# Patient Record
Sex: Male | Born: 1980 | Race: Black or African American | Hispanic: No | Marital: Married | Smoking: Former smoker
Health system: Southern US, Community
[De-identification: ages and names within clinical notes are randomized; demographics above are authoritative.]

## PROBLEM LIST (undated history)

## (undated) DIAGNOSIS — J45909 Unspecified asthma, uncomplicated: Secondary | ICD-10-CM

## (undated) DIAGNOSIS — J42 Unspecified chronic bronchitis: Secondary | ICD-10-CM

## (undated) HISTORY — PX: SURGERY OF LIP: SUR1315

---
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
## 1999-08-15 ENCOUNTER — Emergency Department (HOSPITAL_COMMUNITY): Admission: EM | Admit: 1999-08-15 | Discharge: 1999-08-15 | Payer: Self-pay | Admitting: Emergency Medicine

## 1999-12-27 ENCOUNTER — Emergency Department (HOSPITAL_COMMUNITY): Admission: EM | Admit: 1999-12-27 | Discharge: 1999-12-27 | Payer: Self-pay | Admitting: Emergency Medicine

## 1999-12-27 ENCOUNTER — Encounter: Payer: Self-pay | Admitting: Emergency Medicine

## 2001-06-05 ENCOUNTER — Emergency Department (HOSPITAL_COMMUNITY): Admission: EM | Admit: 2001-06-05 | Discharge: 2001-06-05 | Payer: Self-pay

## 2001-06-11 ENCOUNTER — Emergency Department (HOSPITAL_COMMUNITY): Admission: EM | Admit: 2001-06-11 | Discharge: 2001-06-11 | Payer: Self-pay | Admitting: Emergency Medicine

## 2001-08-16 ENCOUNTER — Emergency Department (HOSPITAL_COMMUNITY): Admission: EM | Admit: 2001-08-16 | Discharge: 2001-08-16 | Payer: Self-pay | Admitting: Emergency Medicine

## 2001-11-06 ENCOUNTER — Encounter: Payer: Self-pay | Admitting: Emergency Medicine

## 2001-11-06 ENCOUNTER — Emergency Department (HOSPITAL_COMMUNITY): Admission: EM | Admit: 2001-11-06 | Discharge: 2001-11-06 | Payer: Self-pay | Admitting: Unknown Physician Specialty

## 2001-11-22 ENCOUNTER — Emergency Department (HOSPITAL_COMMUNITY): Admission: EM | Admit: 2001-11-22 | Discharge: 2001-11-22 | Payer: Self-pay | Admitting: *Deleted

## 2002-04-04 ENCOUNTER — Emergency Department (HOSPITAL_COMMUNITY): Admission: EM | Admit: 2002-04-04 | Discharge: 2002-04-04 | Payer: Self-pay

## 2002-08-03 ENCOUNTER — Emergency Department (HOSPITAL_COMMUNITY): Admission: EM | Admit: 2002-08-03 | Discharge: 2002-08-03 | Payer: Self-pay | Admitting: *Deleted

## 2002-08-16 ENCOUNTER — Encounter: Payer: Self-pay | Admitting: *Deleted

## 2002-08-16 ENCOUNTER — Emergency Department (HOSPITAL_COMMUNITY): Admission: EM | Admit: 2002-08-16 | Discharge: 2002-08-16 | Payer: Self-pay | Admitting: Emergency Medicine

## 2005-07-10 ENCOUNTER — Emergency Department (HOSPITAL_COMMUNITY): Admission: EM | Admit: 2005-07-10 | Discharge: 2005-07-10 | Payer: Self-pay | Admitting: Emergency Medicine

## 2005-07-19 ENCOUNTER — Emergency Department (HOSPITAL_COMMUNITY): Admission: EM | Admit: 2005-07-19 | Discharge: 2005-07-19 | Payer: Self-pay | Admitting: Family Medicine

## 2006-10-21 ENCOUNTER — Emergency Department (HOSPITAL_COMMUNITY): Admission: EM | Admit: 2006-10-21 | Discharge: 2006-10-21 | Payer: Self-pay | Admitting: Emergency Medicine

## 2011-08-10 ENCOUNTER — Emergency Department (HOSPITAL_COMMUNITY)
Admission: EM | Admit: 2011-08-10 | Discharge: 2011-08-10 | Disposition: A | Discharge status: Home / Self Care | Payer: Self-pay | Attending: Emergency Medicine | Admitting: Emergency Medicine

## 2011-08-10 ENCOUNTER — Encounter: Payer: Self-pay | Admitting: *Deleted

## 2011-08-10 DIAGNOSIS — L2989 Other pruritus: Secondary | ICD-10-CM | POA: Insufficient documentation

## 2011-08-10 DIAGNOSIS — L298 Other pruritus: Secondary | ICD-10-CM | POA: Insufficient documentation

## 2011-08-10 DIAGNOSIS — H109 Unspecified conjunctivitis: Secondary | ICD-10-CM

## 2011-08-10 DIAGNOSIS — H5789 Other specified disorders of eye and adnexa: Secondary | ICD-10-CM | POA: Insufficient documentation

## 2011-08-10 DIAGNOSIS — H5710 Ocular pain, unspecified eye: Secondary | ICD-10-CM | POA: Insufficient documentation

## 2011-08-10 MED ORDER — TETRACAINE HCL 0.5 % OP SOLN
1.0000 [drp] | Freq: Once | OPHTHALMIC | Status: AC
  Administered 2011-08-10: 1 [drp] via OPHTHALMIC
  Filled 2011-08-10: qty 2

## 2011-08-10 MED ORDER — NAPHAZOLINE HCL 0.1 % OP SOLN: 1.0000 [drp] | Freq: Four times a day (QID) | OPHTHALMIC | Status: AC | PRN

## 2011-08-10 MED ORDER — FLUORESCEIN SODIUM 1 MG OP STRP
1.0000 | ORAL_STRIP | Freq: Once | OPHTHALMIC | Status: AC
  Administered 2011-08-10: 1 via OPHTHALMIC
  Filled 2011-08-10: qty 1

## 2011-08-10 NOTE — ED Notes (Signed)
Pt reports 2 day hx of left eye burning and redness.

## 2011-08-10 NOTE — Progress Notes (Signed)
30 year old male has had redness and irritation of his right eye for the last 2 days. He has no known sick contacts. There's been some watery drainage, but no purulent drainage. He denies any photophobia. Exam is consistent with a viral conjunctivitis.

## 2011-08-10 NOTE — ED Provider Notes (Signed)
History     CSN: [id number] Arrival date & time: 08/10/2011  4:08 PM   First MD Initiated Contact with Patient 08/10/11 1616      Chief Complaint  Patient presents with  . Eye Problem    (Consider location/radiation/quality/duration/timing/severity/associated sxs/prior treatment) The history is provided by the patient.   patient presents with left eye pain and redness for the last 2 days. He has been constant and progressively worsening. Patient notes associated itching of eye and mild pain. He denies change in visual acuity, preceding trauma, foreign bodies, periorbital swelling, recent fever, sick contact. Overall severity described as mild.  He has tried hot compresses which made him mildly better.  Past Medical History  Diagnosis Date  . Bronchitis     No past surgical history on file.  No family history on file.  History  Substance Use Topics  . Smoking status: Never Smoker   . Smokeless tobacco: Not on file  . Alcohol Use: Yes      Review of Systems  Constitutional: Negative for fever, chills and activity change.  HENT: Negative for congestion and neck pain.   Eyes: Positive for redness and itching.  Respiratory: Negative for cough, chest tightness, shortness of breath and wheezing.   Cardiovascular: Negative for chest pain.  Gastrointestinal: Negative for nausea, vomiting, abdominal pain, diarrhea and abdominal distention.  Genitourinary: Negative for difficulty urinating.  Musculoskeletal: Negative for gait problem.  Skin: Negative for rash.  Neurological: Negative for weakness and numbness.  Psychiatric/Behavioral: Negative for behavioral problems and confusion.  All other systems reviewed and are negative.    Allergies  Review of patient's allergies indicates no known allergies.  Home Medications   Current Outpatient Rx  Name Route Sig Dispense Refill  . NAPHAZOLINE HCL 0.1 % OP SOLN Left Eye Place 1 drop into the left eye 4 (four) times daily as  needed. 15 mL 0    BP 133/94  Temp(Src) 97.7 F (36.5 C) (Oral)  Resp 18  SpO2 100%  Physical Exam  Nursing note and vitals reviewed. Constitutional: He is oriented to person, place, and time. He appears well-developed and well-nourished. No distress.  HENT:  Head: Normocephalic.  Nose: Nose normal.  Eyes: EOM are normal. Pupils are equal, round, and reactive to light.       Visual acuity: Right eye 20/25, left eye 20/25 Left eye: Diffuse injection. Fluorescein staining done and no uptake suggesting corneal abrasion. No pain with consensual constriction.  Neck: Normal range of motion.  Cardiovascular: Regular rhythm and intact distal pulses.   Pulmonary/Chest: Effort normal. No respiratory distress.  Abdominal: He exhibits no distension.       No visible injury  Musculoskeletal: Normal range of motion. He exhibits no edema and no tenderness.  Neurological: He is alert and oriented to person, place, and time.       Normal strength  Skin: Skin is warm and dry. He is not diaphoretic.  Psychiatric: He has a normal mood and affect. His behavior is normal. Thought content normal.    ED Course  Procedures (including critical care time)  Labs Reviewed - No data to display No results found.   1. Conjunctivitis       MDM   Overall clinical picture is consistent with left eye conjunctivitis. No corneal abrasions. No preceding trauma to suggest iritis. Most likely viral versus allergic in etiology based on her recent discharge. Treatment options and return precautions discussed.        Milus Glazier  08/11/11 0128 

## 2011-08-13 NOTE — ED Provider Notes (Signed)
I saw and evaluated the patient, reviewed the resident's note and I agree with the findings and plan.   Dione Booze, MD 08/13/11 [phone]

## 2014-10-18 ENCOUNTER — Emergency Department (HOSPITAL_COMMUNITY)
Admission: EM | Admit: 2014-10-18 | Discharge: 2014-10-18 | Disposition: A | Discharge status: Home / Self Care | Payer: Self-pay | Attending: Emergency Medicine | Admitting: Emergency Medicine

## 2014-10-18 ENCOUNTER — Encounter (HOSPITAL_COMMUNITY): Payer: Self-pay | Admitting: *Deleted

## 2014-10-18 DIAGNOSIS — Y9289 Other specified places as the place of occurrence of the external cause: Secondary | ICD-10-CM | POA: Insufficient documentation

## 2014-10-18 DIAGNOSIS — X58XXXA Exposure to other specified factors, initial encounter: Secondary | ICD-10-CM | POA: Insufficient documentation

## 2014-10-18 DIAGNOSIS — Y998 Other external cause status: Secondary | ICD-10-CM | POA: Insufficient documentation

## 2014-10-18 DIAGNOSIS — Z8709 Personal history of other diseases of the respiratory system: Secondary | ICD-10-CM | POA: Insufficient documentation

## 2014-10-18 DIAGNOSIS — S39840A Fracture of corpus cavernosum penis, initial encounter: Secondary | ICD-10-CM | POA: Insufficient documentation

## 2014-10-18 DIAGNOSIS — Y9389 Activity, other specified: Secondary | ICD-10-CM | POA: Insufficient documentation

## 2014-10-18 NOTE — Consult Note (Signed)
Urology Consult  Referring physician: Terri Piedraourtney Forcucci, PA-C [facility] Reason for referral: Concern for penile fracture/blood per urethra  History of Present Illness: Daniel Berger is 34 years of age and without prior urologic history. Last night during sexual activity he had penile trauma. During intercourse his penis struck his partners pubic bone. He does not recall hearing a popping or cracking sound. He did not develop immediate detumescence of his penis. Initially he did notice blood from his urethra but then it subsequently subsided. He has continued throughout the day now to have initial hematuria. He otherwise reports a strong urinary stream and clear urine once the initial blood comes out. He reports having 1 or 2 erection since the injury that he describes as fairly normal. He believes that there was moderate swelling of the penile shaft but not until this morning hours after the incident. He does admit however to drinking alcohol during the event. More recently he has noticed considerable edema underneath the glans penis. He is continued to have a small amount of oozing from the meatus.  Past Medical History  Diagnosis Date  . Bronchitis    History reviewed. No pertinent past surgical history.  Medications: Prior to Admission:  (Not in a hospital admission)  Allergies: No Known Allergies  No family history on file.  Social History:  reports that he has never smoked. He does not have any smokeless tobacco history on file. He reports that he drinks alcohol. His drug history is not on file.  ROS only for above-mentioned complaints. Otherwise negative.  Physical Exam:  Vital signs in last 24 hours: Temp:  [97.6 F (36.4 C)] 97.6 F (36.4 C) (02/20 2041) Pulse Rate:  [64-67] 67 (02/20 2131) Resp:  [16-18] 16 (02/20 2131) BP: (151-156)/(100-109) 151/100 mmHg (02/20 2131) SpO2:  [100 %] 100 % (02/20 2131) Weight:  [63.504 kg (140 lb)] 63.504 kg (140 lb) (02/20 2041)  Constitutional:  Vital signs reviewed. WD WN in NAD Head: Normocephalic and atraumatic   Eyes: PERRL, No scleral icterus.  Neck: Supple No  Gross JVD Cardiovascular: RRR Pulmonary/Chest: Normal effort Abdominal: Soft. Non-tender, non-distended. Genitourinary: The patient has a circumcised penis. There is considerable soft tissue edema in the frenular region. The entire shaft is mildly swollen. There is mild oozing of blood from the meatus. There is slight swelling in the skin on the anterior aspect of the scrotum near the penile scrotal junction. Palpation of the distal corporal cylinders is normal. More proximally there is an area where I do feel there is a probable rent consistent with a penile fracture. With palpation of that area we noticed increased blood per urethra. Testes adnexal structures within normal limits. Extremities: No cyanosis or edema  Neurological: Grossly non-focal.  Skin: Warm,very dry and intact. No rash, cyanosis   Laboratory Data:  No results found for this or any previous visit (from the past 72 hour(s)). No results found for this or any previous visit (from the past 240 hour(s)). Creatinine: No results for input(s): CREATININE in the last 168 hours. Baseline Creatinine:   Impression/Assessment:  I believe it is extremely likely that this patient does indeed have a proximal corporal fracture and probable urethral injury/tear. He is told that the standard of care in this situation is penile exploration with a degloving incision and careful inspection of both corporal cylinders. If a tear or rent is found which in my opinion is likely that would be surgically corrected. He would need further evaluation of his urethra with flexible  ureteroscopy and or retrograde urethrogram. If there does appear to be urethral injury then catheter placement will have to be performed and he may need an indwelling catheter for a period of time. He is told that data shows that without surgical correction of  penile fracture there is going to be a higher risk of subsequently developing erectile dysfunction, penile curvature/deformity and in the case of urethral injury potentially a urethral stricture.  It is my strong recommendation that we proceed with an exploration tonight with repair of penile fracture if indeed 1 is found. He appeared to understand this and asked appropriate and reasonable questions. He expressed considerable hesitancy with going ahead with surgery especially tonight. He told me that he had nobody to look after his children and that there was no way he could have a surgical procedure this evening. We did talk about his options for doing something tomorrow but he was noncommittal.  Plan:  Patient has refused/deferred penile exploration with probable repair of penile fracture and evaluation for possible urethral injury. He appeared to understand the rationale for proceeding with surgical intervention and seemed to understand the potential increased risk he will have by delaying or failing to undergo the appropriate surgical management. He is told that we would be happy to proceed with surgical intervention if he changes his mind over the next 72 hours. If he does report present for consideration of surgery I suggested he go to [facility] long emergency room since we do more urologic procedures at that facility. I have asked the ER staff to provide him with my name and number as well as our office address. If he decides not to re-presented this weekend I would strongly encourage him to call to get in early this coming week for reassessment. We potentially could operate on him as late as one week out from a fracture although again sooner is better in my opinion.  Zyiah Withington S 10/18/2014, 10:41 PM

## 2014-10-18 NOTE — ED Notes (Signed)
Pt c/o penile swelling and bloody discharge onset last night after injuring penis during intercourse. Reports a gush of blood when injury occurred that subsided; has noticed blood in his urine since this morning. Denies pain at this time or urinary issues.

## 2014-10-18 NOTE — ED Notes (Signed)
Urology at bedside.

## 2014-10-18 NOTE — Discharge Instructions (Signed)
Penile Fracture Fracture of the penis is an uncommon injury of the erect penis. This injury most often happens during forceful sexual intercourse, but it may happen under any circumstances when an erection occurs. This is not a fractured bone. The penis contains only soft and fibrous (tough leathery) tissue. The tough fibrous layers inside are the structures which fill up with blood during an erection (hardening of the penis). If a rupture or break of one of these layers occurs, it is called a penile fracture. There may also be injury to the urethra (the tube in the penis which carries the urine from the bladder). SYMPTOMS  This injury is usually noticed right away because of:  Pain.  A changed shape of the penis. DIAGNOSIS   The diagnosis can be made by your caregiver talking to you and learning how the injury happened.  The diagnosis can be made by examining you.  Specialized testing may sometimes be done to confirm a suspected diagnosis or to find out what damage needs to be repaired during surgery. These tests include:  Ultrasonography (imaging technique used to look inside the body).  Cavernosography (x-ray of the blood flow in the penis).  Urethrography (x-ray of the urethra). TREATMENT  When the problem is severe, it is considered a surgical emergency. The sooner the problem is repaired, the more likely there will be a good result. Emergency surgical repair offers the best chance of recovery with a correct working penis.  If conservative (nonsurgical) treatment is used, there may be other complications such as:  A bulging out of the side of the penis (aneurysm)  Scarring and hardening of the penis  Abnormal curvature  Erectile dysfunction (problems with impotence)  The final outcome will be unsatisfactory or poor. Minor cases of fractured penis may be treated conservatively. The decision to treat conservatively or with surgery should be made with your urologist to fully  understand the pros and cons of these different treatments. Document Released: 06/28/2004 Document Revised: 11/07/2011 Document Reviewed: 06/27/2008 [facility] Patient Information 2015 [place], [place]. This information is not intended to replace advice given to you by your health care provider. Make sure you discuss any questions you have with your health care provider.   Emergency Department Resource Guide 1) Find a Doctor and Pay Out of Pocket Although you won't have to find out who is covered by your insurance plan, it is a good idea to ask around and get recommendations. You will then need to call the office and see if the doctor you have chosen will accept you as a new patient and what types of options they offer for patients who are self-pay. Some doctors offer discounts or will set up payment plans for their patients who do not have insurance, but you will need to ask so you aren't surprised when you get to your appointment.  2) Contact Your Local Health Department Not all health departments have doctors that can see patients for sick visits, but many do, so it is worth a call to see if yours does. If you don't know where your local health department is, you can check in your phone book. The CDC also has a tool to help you locate your state's health department, and many state websites also have listings of all of their local health departments.  3) Find a Walk-in Clinic If your illness is not likely to be very severe or complicated, you may want to try a walk in clinic. These are popping up all  over the country in pharmacies, drugstores, and shopping centers. They're usually staffed by nurse practitioners or physician assistants that have been trained to treat common illnesses and complaints. They're usually fairly quick and inexpensive. However, if you have serious medical issues or chronic medical problems, these are probably not your best option.  No Primary Care Doctor: - Call Health Connect  at  [phone] - they can help you locate a primary care doctor that  accepts your insurance, provides certain services, etc. - Physician Referral Service- [phone]  Chronic Pain Problems: Organization         Address  Phone   Notes  [facility]  [phone] Patients need to be referred by their primary care doctor.   Medication Assistance: Organization         Address  Phone   Notes  [facility] Medication [facility] [address] [place] [place], [place] [address] [phone] --Must be a resident of [facility] -- Must have NO insurance coverage whatsoever (no Medicaid/ Medicare, etc.) -- The pt. MUST have a primary care doctor that directs their care regularly and follows them in the community   [facility]  [phone]   [employer]  [phone]    Agencies that provide inexpensive medical care: Organization         Address  Phone   Notes  [facility]  [phone]   [facility]    [phone]   [facility] [address] [place], [place] [address] [phone]   [facility] [address] [place] [phone]   Planned Parenthood    [phone]   [facility]    [phone]   [facility]  [address], [place] Phone:  [phone], Fax:  [phone] Hours of Operation:  9 am - 6 pm, M-F.  Also accepts Medicaid/Medicare and self-pay.  [facility]  [address], [place] Phone: [phone], Fax: [phone]. Hours of Operation:  8:30 am - 5:30 pm, M-F.  Also accepts Medicaid and self-pay.  [facility] [place] [address], [place] Phone: [phone]   [facility] [address] Natasha Bence [place], [place] [phone], Ext. [phone] Mondays & Thursdays: 7-9 AM.  First 15 patients are seen on a first come, first serve basis.     Medicaid-accepting [facility] Providers:  Organization         Address  Phone   Notes  [facility] [address], [place] [phone] Also accepts self-pay patients.  [facility] [address] Laurell Josephs [place], [place]  [phone]   [facility] [address], [place] [phone]   [facility] [address] [place] [phone]   Renaye Rakers [address], [place]   [phone] Only accepts [place] Access [place] patients after they have their name applied to their card.   Self-Pay (no insurance) in [facility]:  Organization         Address  Phone   Notes  Sickle Cell Patients, [facility] [address] [place], [place] [phone]   [facility] [address] [place], [place] [phone]   [facility] [place]  [address],  [phone]   [facility]/Dr. Osei-Bonsu  [address] [place] [address], [place] or [address], [place] [phone] Phone number for both [place] and [place] locations is the same.  [facility] [address], [place] [phone]) [phone]   [facility] [place] [address] [place] [address], [place] or [address] [phone]) [phone]   [facility] [address], [place] [phone]) [phone], phone; [phone](336) [phone], fax Sees patients 1st and 3rd Saturday of every month.  Must not qualify for public or private insurance (i.e. Medicaid, Medicare, [place] Health Choice, Veterans' Benefits)  Household income should be no more than 200% of the poverty level The clinic cannot treat you if you are pregnant or think you are pregnant  Sexually transmitted diseases are not treated at the clinic.    Dental Care: Organization         Address  Phone  Notes  [facility]  [facility] [address], [place] [phone] Accepts children up to age 35 who are enrolled in [place] or [place] Health Choice; pregnant women with a Medicaid card; and children who have applied for Medicaid or [place] Health Choice, but were declined, whose parents can pay a reduced fee at time of service.  [facility] [place]  [address], [place] [phone] Accepts children up to age 721 who are enrolled in [place] or [place] Health Choice; pregnant women with a Medicaid card; and children who have applied for Medicaid or [place] Health Choice, but were declined, whose parents can pay a reduced fee at time of service.  [facility]  [address], [place] [phone]) [phone] Patients are seen by appointment only. Walk-ins are not accepted. [facility] will see patients 35 years of age and older. Monday - Tuesday (8am-5pm) Most Wednesdays (8:30-5pm) $30 per visit, cash only  [facility]  [address], [facility] [place] [phone]) [phone] Patients are seen by appointment only. Walk-ins are not accepted. [facility] will see patients 35 years of age and older. One Wednesday Evening (Monthly: Volunteer Based).  $30 per visit, cash only  [facility]  [phone] for adults; Children under age 35, call Graduate Pediatric Dentistry at [phone]. Children aged 594-14, please call [phone] to request a pediatric application.  Dental services are provided in all areas of dental care including fillings, crowns and bridges, complete and partial dentures, implants, gum treatment, root canals, and extractions. Preventive care is also provided. Treatment is provided to both adults and children. Patients are selected via a lottery and there is often a waiting list.   [facility] [address], [facility]  [phone]) [phone]  [email or web address]   [facility] [address], [place], [place] [phone], Ext. [phone] Second and Fourth Thursday of each month, opens at 6:30 AM; Clinic ends at 9 AM.  Patients are seen on a first-come first-served basis, and a limited number are seen during each clinic.   [facility]  [address], [place], [place] [phone]   Eligibility Requirements You must have lived in [place], [place], or [place] counties for at least the last three months.   You cannot be eligible for state or federal sponsored National Cityhealthcare insurance, including CIGNAVeterans Administration, [place]icaid, or [employer]icare.  You generally cannot be eligible for healthcare insurance through your employer.    How to apply: Eligibility screenings are held every Tuesday and Wednesday afternoon from 1:00 pm until 4:00 pm. You do not need an appointment for the interview!  [facility] [address] [place], [place] [phone]   [facility] Health Department  [phone]   [facility] Health Department  [phone]   [facility] Health Department  [phone]    Behavioral Health Resources in the Community: Intensive Outpatient Programs Organization         Address  Phone  Notes  [facility] Services [address], [place], [place] [phone]   [facility] [address], [place], [place] [phone]   [facility] [address], [place], [place]  [phone]   [facility] Mental Health [address]  [place], [place] [phone] or [phone]   Substance Abuse Resources Organization         Address  Phone  Notes  [facility] Drug Services  [phone]   [facility]  [phone]   [facility]  [phone]   Floydene Flock  [phone]   Residential & Outpatient Substance Abuse Program  [phone]   Psychological Services Organization          Address  Phone  Notes  [facility]  [phone]   [facility]  [phone]   [facility] Mental Health [address], [place] [phone] or [phone]    Mobile Crisis Teams Organization         Address  Phone  Notes  [facility], Mobile Crisis Care Unit  [phone]   [facility]  [address] [place], [place] [phone]   Doristine Locks [address] [place] [phone]    Self-Help/Support Groups Organization         Address  Phone             Notes  [facility] [place] - variety of support groups  [phone]- I7437963 Call for more information  Narcotics Anonymous (NA), [facility] [address], [employer] [place]  2 meetings at this location   Statistician         Address  Phone  Notes  [facility] [address],    [place]  [phone]   [facility]  [address], [place] [address], [place], [place] [phone]   [facility] [address] [place], [place] [phone] Admissions: 8am-3pm M-F  [facility] [address],    [place], [place] [phone]   [facility] [address] [place], [place], [place] [phone]   [facility] [address],  [place], [place] [phone]   [facility] - Intensive Outpatient [address]urell Josephs [address], [place], [place] [phone]   [facility] ([facility] [address] [place].,  [place], [place] [phone] or [phone]   [facility] ([facility]) [address], [place], [place] [phone] Accepts Medicaid  [facility] [address]  [place] [phone] Substance Abuse/Addiction Treatment   [facility] Organization         Address  Phone  Notes  [facility]  [phone]   Angie Fava, PhD [address] Skeeters [place], [place]   [phone] or [phone]   [facility]  [place], [place] [phone]   [facility] [address] [place], [place] [phone] Insurance/Medicaid/sponsorship through [facility] Families [address] [place], [place] [phone] [place], [place] [phone]    Dr. Adele Schilder  [phone]   [facility] Dept. 1) [address] [place] 2) [place] 3)  [address], [place] [phone]  [phone]   [place] [phone] or [phone] (After Hours)

## 2014-10-18 NOTE — ED Notes (Signed)
The pt is c/o penis pain and bloody discharge since last pm.  No pain now
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
## 2016-09-09 ENCOUNTER — Emergency Department (HOSPITAL_COMMUNITY): Payer: Self-pay

## 2016-09-09 ENCOUNTER — Encounter (HOSPITAL_COMMUNITY): Payer: Self-pay | Admitting: Radiology

## 2016-09-09 ENCOUNTER — Inpatient Hospital Stay (HOSPITAL_COMMUNITY)
Admission: EM | Admit: 2016-09-09 | Discharge: 2016-09-16 | DRG: 392 | Disposition: A | Discharge status: Home Health | Payer: Self-pay | Attending: Internal Medicine | Admitting: Internal Medicine

## 2016-09-09 DIAGNOSIS — K651 Peritoneal abscess: Secondary | ICD-10-CM | POA: Diagnosis present

## 2016-09-09 DIAGNOSIS — K632 Fistula of intestine: Secondary | ICD-10-CM | POA: Diagnosis present

## 2016-09-09 DIAGNOSIS — Z789 Other specified health status: Secondary | ICD-10-CM | POA: Diagnosis present

## 2016-09-09 DIAGNOSIS — F17210 Nicotine dependence, cigarettes, uncomplicated: Secondary | ICD-10-CM | POA: Diagnosis present

## 2016-09-09 DIAGNOSIS — Z833 Family history of diabetes mellitus: Secondary | ICD-10-CM

## 2016-09-09 DIAGNOSIS — Z7289 Other problems related to lifestyle: Secondary | ICD-10-CM | POA: Diagnosis present

## 2016-09-09 DIAGNOSIS — F1010 Alcohol abuse, uncomplicated: Secondary | ICD-10-CM | POA: Diagnosis present

## 2016-09-09 DIAGNOSIS — K5780 Diverticulitis of intestine, part unspecified, with perforation and abscess without bleeding: Secondary | ICD-10-CM

## 2016-09-09 DIAGNOSIS — K5720 Diverticulitis of large intestine with perforation and abscess without bleeding: Principal | ICD-10-CM | POA: Diagnosis present

## 2016-09-09 DIAGNOSIS — B9620 Unspecified Escherichia coli [E. coli] as the cause of diseases classified elsewhere: Secondary | ICD-10-CM | POA: Diagnosis present

## 2016-09-09 DIAGNOSIS — Z8249 Family history of ischemic heart disease and other diseases of the circulatory system: Secondary | ICD-10-CM

## 2016-09-09 DIAGNOSIS — J45909 Unspecified asthma, uncomplicated: Secondary | ICD-10-CM | POA: Diagnosis present

## 2016-09-09 DIAGNOSIS — F1090 Alcohol use, unspecified, uncomplicated: Secondary | ICD-10-CM | POA: Diagnosis present

## 2016-09-09 DIAGNOSIS — N2882 Megaloureter: Secondary | ICD-10-CM

## 2016-09-09 HISTORY — DX: Unspecified asthma, uncomplicated: J45.909

## 2016-09-09 LAB — CBC
HCT: 36.5 % — ABNORMAL LOW (ref 39.0–52.0)
HEMOGLOBIN: 12.3 g/dL — AB (ref 13.0–17.0)
MCH: 30.4 pg (ref 26.0–34.0)
MCHC: 33.7 g/dL (ref 30.0–36.0)
MCV: 90.1 fL (ref 78.0–100.0)
Platelets: 440 10*3/uL — ABNORMAL HIGH (ref 150–400)
RBC: 4.05 MIL/uL — ABNORMAL LOW (ref 4.22–5.81)
RDW: 12.6 % (ref 11.5–15.5)
WBC: 12 10*3/uL — AB (ref 4.0–10.5)

## 2016-09-09 LAB — COMPREHENSIVE METABOLIC PANEL
ALT: 22 U/L (ref 17–63)
ANION GAP: 11 (ref 5–15)
AST: 14 U/L — ABNORMAL LOW (ref 15–41)
Albumin: 3 g/dL — ABNORMAL LOW (ref 3.5–5.0)
Alkaline Phosphatase: 57 U/L (ref 38–126)
BILIRUBIN TOTAL: 0.4 mg/dL (ref 0.3–1.2)
BUN: 5 mg/dL — ABNORMAL LOW (ref 6–20)
CO2: 25 mmol/L (ref 22–32)
Calcium: 8.9 mg/dL (ref 8.9–10.3)
Chloride: 101 mmol/L (ref 101–111)
Creatinine, Ser: 1.02 mg/dL (ref 0.61–1.24)
Glucose, Bld: 96 mg/dL (ref 65–99)
Potassium: 4.3 mmol/L (ref 3.5–5.1)
Sodium: 137 mmol/L (ref 135–145)
TOTAL PROTEIN: 7.6 g/dL (ref 6.5–8.1)

## 2016-09-09 LAB — URINALYSIS, ROUTINE W REFLEX MICROSCOPIC
Bilirubin Urine: NEGATIVE
Glucose, UA: NEGATIVE mg/dL
Hgb urine dipstick: NEGATIVE
KETONES UR: NEGATIVE mg/dL
LEUKOCYTES UA: NEGATIVE
Nitrite: NEGATIVE
Protein, ur: NEGATIVE mg/dL
SPECIFIC GRAVITY, URINE: 1.018 (ref 1.005–1.030)
pH: 5 (ref 5.0–8.0)

## 2016-09-09 LAB — LIPASE, BLOOD: Lipase: 25 U/L (ref 11–51)

## 2016-09-09 MED ORDER — PIPERACILLIN-TAZOBACTAM 3.375 G IVPB
3.3750 g | Freq: Once | INTRAVENOUS | Status: DC
  Filled 2016-09-09: qty 50

## 2016-09-09 MED ORDER — IOPAMIDOL (ISOVUE-300) INJECTION 61%
100.0000 mL | Freq: Once | INTRAVENOUS | Status: AC | PRN
  Administered 2016-09-09: 100 mL via INTRAVENOUS

## 2016-09-09 NOTE — ED Provider Notes (Signed)
[facility]-EMERGENCY DEPT Provider Note   CSN: [id number] Arrival date & time: 09/09/16  1241     History   Chief Complaint Chief Complaint  Patient presents with  . Abdominal Pain  . Diarrhea    HPI Daniel Berger is a 36 y.o. male. He reports a sensation of abdominal discomfort, associated with pain in his anal area when sitting, and feeling like he can have a good bowel movement. He is taking milk of magnesia and having loose stools but does not feel that he is completely evacuating. He has had decreased appetite but no nausea or vomiting. No penile discharge, dysuria, hematuria. No scrotal pain. He denies fever, chills, cough, chest pain or back pain. No similar problem in the past.  HPI  Past Medical History:  Diagnosis Date  . Bronchitis     There are no active problems to display for this patient.   No past surgical history on file.     Home Medications    Prior to Admission medications   Not on File    Family History No family history on file.  Social History Social History  Substance Use Topics  . Smoking status: Never Smoker  . Smokeless tobacco: Not on file  . Alcohol use Yes     Allergies   Patient has no known allergies.   Review of Systems Review of Systems  All other systems reviewed and are negative.    Physical Exam Updated Vital Signs BP 104/72   Pulse 71   Temp 98.4 F (36.9 C) (Oral)   Resp 14   SpO2 93%   Physical Exam  Constitutional: He is oriented to person, place, and time. He appears well-developed and well-nourished. No distress.  HENT:  Head: Normocephalic and atraumatic.  Right Ear: External ear normal.  Left Ear: External ear normal.  Eyes: Conjunctivae and EOM are normal. Pupils are equal, round, and reactive to light.  Neck: Normal range of motion and phonation normal. Neck supple.  Cardiovascular: Normal rate, regular rhythm and normal heart sounds.   Pulmonary/Chest: Effort normal and breath sounds normal.  He exhibits no bony tenderness.  Abdominal: Soft. There is no tenderness.  Genitourinary: Rectum normal and penis normal.  Genitourinary Comments: Normal anus. No stool in rectum. Prostate somewhat enlarged and tender. State nodularity. Normal scrotum and scrotum contents.  Musculoskeletal: Normal range of motion.  Neurological: He is alert and oriented to person, place, and time. No cranial nerve deficit or sensory deficit. He exhibits normal muscle tone. Coordination normal.  Skin: Skin is warm, dry and intact.  Psychiatric: He has a normal mood and affect. His behavior is normal. Judgment and thought content normal.  Nursing note and vitals reviewed.    ED Treatments / Results  Labs (all labs ordered are listed, but only abnormal results are displayed) Labs Reviewed  COMPREHENSIVE METABOLIC PANEL - Abnormal; Notable for the following:       Result Value   BUN 5 (*)    Albumin 3.0 (*)    AST 14 (*)    All other components within normal limits  CBC - Abnormal; Notable for the following:    WBC 12.0 (*)    RBC 4.05 (*)    Hemoglobin 12.3 (*)    HCT 36.5 (*)    Platelets 440 (*)    All other components within normal limits  LIPASE, BLOOD  URINALYSIS, ROUTINE W REFLEX MICROSCOPIC    EKG  EKG Interpretation None  Radiology No results found.  Procedures Procedures (including critical care time)  Medications Ordered in ED Medications - No data to display   Initial Impression / Assessment and Plan / ED Course  I have reviewed the triage vital signs and the nursing notes.  Pertinent labs & imaging results that were available during my care of the patient were reviewed by me and considered in my medical decision making (see chart for details).  Clinical Course     Medications - No data to display  Patient Vitals for the past 24 hrs:  BP Temp Temp src Pulse Resp SpO2  09/09/16 2100 104/72 - - 71 - 93 %  09/09/16 2045 110/76 - - 73 - 94 %  09/09/16 2030  101/72 - - 71 - 96 %  09/09/16 2015 103/72 - - 76 - 97 %  09/09/16 2000 107/72 - - 74 - 96 %  09/09/16 1945 106/68 - - 74 - 97 %  09/09/16 1930 109/68 - - 74 - 95 %  09/09/16 1915 125/81 - - 79 - 99 %  09/09/16 1828 111/72 - - 90 14 99 %  09/09/16 1544 116/74 - - 77 16 97 %  09/09/16 1259 131/98 98.4 F (36.9 C) Oral 88 16 99 %    9:23 PM Reevaluation with update and discussion. After initial assessment and treatment, an updated evaluation reveals No change in clinical status. Patient updated on findings and plan.Flint Melter. Ledarrius Beauchaine L   Consultation Gen. Surgery-  He will see. 00:06  11:41 PM-Consult complete with Hospitalist. Patient case explained and discussed. He agrees to admit patient for further evaluation and treatment. Call ended at 00:20   Final Clinical Impressions(s) / ED Diagnoses   Final diagnoses:  Diverticulitis of large intestine with abscess without bleeding   Pelvic abscess, source, diverticulitis, sigmoid colon. Doubt sepsis, metabolic instability or impending vascular collapse.  Nursing Notes Reviewed/ Care Coordinated Applicable Imaging Reviewed Interpretation of Laboratory Data incorporated into ED treatment  Plan : Admit   New Prescriptions New Prescriptions   No medications on file     Mancel BaleElliott Alyus Mofield, MD 09/10/16 [phone]

## 2016-09-09 NOTE — ED Notes (Signed)
Patient transported to CT 

## 2016-09-09 NOTE — ED Triage Notes (Signed)
aBD PAIN AND DIARRHEA  X 1 week , deneis dysuria

## 2016-09-10 ENCOUNTER — Encounter (HOSPITAL_COMMUNITY): Payer: Self-pay | Admitting: Internal Medicine

## 2016-09-10 ENCOUNTER — Inpatient Hospital Stay (HOSPITAL_COMMUNITY): Payer: Self-pay

## 2016-09-10 DIAGNOSIS — J45909 Unspecified asthma, uncomplicated: Secondary | ICD-10-CM | POA: Diagnosis present

## 2016-09-10 DIAGNOSIS — Z7289 Other problems related to lifestyle: Secondary | ICD-10-CM | POA: Diagnosis present

## 2016-09-10 DIAGNOSIS — K5720 Diverticulitis of large intestine with perforation and abscess without bleeding: Secondary | ICD-10-CM | POA: Diagnosis present

## 2016-09-10 DIAGNOSIS — K651 Peritoneal abscess: Secondary | ICD-10-CM | POA: Diagnosis present

## 2016-09-10 DIAGNOSIS — Z789 Other specified health status: Secondary | ICD-10-CM | POA: Diagnosis present

## 2016-09-10 LAB — CBC
HCT: 36.1 % — ABNORMAL LOW (ref 39.0–52.0)
Hemoglobin: 12.1 g/dL — ABNORMAL LOW (ref 13.0–17.0)
MCH: 30 pg (ref 26.0–34.0)
MCHC: 33.5 g/dL (ref 30.0–36.0)
MCV: 89.6 fL (ref 78.0–100.0)
PLATELETS: 444 10*3/uL — AB (ref 150–400)
RBC: 4.03 MIL/uL — AB (ref 4.22–5.81)
RDW: 12.6 % (ref 11.5–15.5)
WBC: 10.7 10*3/uL — ABNORMAL HIGH (ref 4.0–10.5)

## 2016-09-10 LAB — BASIC METABOLIC PANEL
Anion gap: 9 (ref 5–15)
BUN: 6 mg/dL (ref 6–20)
CALCIUM: 8.8 mg/dL — AB (ref 8.9–10.3)
CHLORIDE: 102 mmol/L (ref 101–111)
CO2: 24 mmol/L (ref 22–32)
CREATININE: 0.84 mg/dL (ref 0.61–1.24)
GFR calc non Af Amer: >60 mL/min (ref >60)
GLUCOSE: 93 mg/dL (ref 65–99)
Potassium: 3.9 mmol/L (ref 3.5–5.1)
Sodium: 135 mmol/L (ref 135–145)

## 2016-09-10 LAB — PROTIME-INR
INR: 1.3
PROTHROMBIN TIME: 16.3 s — AB (ref 11.4–15.2)

## 2016-09-10 LAB — TYPE AND SCREEN
ABO/RH(D): B POS
Antibody Screen: NEGATIVE

## 2016-09-10 LAB — ABO/RH: ABO/RH(D): B POS

## 2016-09-10 LAB — LACTIC ACID, PLASMA
LACTIC ACID, VENOUS: 1 mmol/L (ref 0.5–1.9)
LACTIC ACID, VENOUS: 0.8 mmol/L (ref 0.5–1.9)

## 2016-09-10 LAB — C DIFFICILE QUICK SCREEN W PCR REFLEX
C DIFFICILE (CDIFF) INTERP: NOT DETECTED
C Diff antigen: NEGATIVE
C Diff toxin: NEGATIVE

## 2016-09-10 LAB — APTT: aPTT: 37 seconds — ABNORMAL HIGH (ref 24–36)

## 2016-09-10 LAB — PROCALCITONIN: PROCALCITONIN: 0.14 ng/mL

## 2016-09-10 MED ORDER — VITAMIN B-1 100 MG PO TABS
100.0000 mg | ORAL_TABLET | Freq: Every day | ORAL | Status: DC
  Administered 2016-09-10 – 2016-09-16 (×7): 100 mg via ORAL
  Filled 2016-09-10 (×7): qty 1

## 2016-09-10 MED ORDER — ADULT MULTIVITAMIN W/MINERALS CH
1.0000 | ORAL_TABLET | Freq: Every day | ORAL | Status: DC
  Administered 2016-09-10 – 2016-09-16 (×7): 1 via ORAL
  Filled 2016-09-10 (×7): qty 1

## 2016-09-10 MED ORDER — ONDANSETRON HCL 4 MG/2ML IJ SOLN: 4.0000 mg | Freq: Three times a day (TID) | INTRAMUSCULAR | Status: DC | PRN

## 2016-09-10 MED ORDER — MIDAZOLAM HCL 2 MG/2ML IJ SOLN
INTRAMUSCULAR | Status: AC | PRN
  Administered 2016-09-10: 1 mg via INTRAVENOUS
  Administered 2016-09-10 (×3): 0.5 mg via INTRAVENOUS
  Administered 2016-09-10: 1 mg via INTRAVENOUS

## 2016-09-10 MED ORDER — LIDOCAINE HCL 1 % IJ SOLN
INTRAMUSCULAR | Status: AC
Start: 2016-09-10 — End: 2016-09-11
  Filled 2016-09-10: qty 20

## 2016-09-10 MED ORDER — FENTANYL CITRATE (PF) 100 MCG/2ML IJ SOLN
INTRAMUSCULAR | Status: AC
  Filled 2016-09-10: qty 4

## 2016-09-10 MED ORDER — SODIUM CHLORIDE 0.9 % IV BOLUS (SEPSIS)
2500.0000 mL | Freq: Once | INTRAVENOUS | Status: AC
  Administered 2016-09-10: 2500 mL via INTRAVENOUS

## 2016-09-10 MED ORDER — METRONIDAZOLE IN NACL 5-0.79 MG/ML-% IV SOLN
500.0000 mg | Freq: Three times a day (TID) | INTRAVENOUS | Status: DC
  Administered 2016-09-10 – 2016-09-14 (×14): 500 mg via INTRAVENOUS
  Filled 2016-09-10 (×15): qty 100

## 2016-09-10 MED ORDER — LORAZEPAM 2 MG/ML IJ SOLN: 0.0000 mg | Freq: Two times a day (BID) | INTRAMUSCULAR | Status: DC

## 2016-09-10 MED ORDER — FOLIC ACID 1 MG PO TABS
1.0000 mg | ORAL_TABLET | Freq: Every day | ORAL | Status: DC
  Administered 2016-09-10 – 2016-09-16 (×7): 1 mg via ORAL
  Filled 2016-09-10 (×7): qty 1

## 2016-09-10 MED ORDER — LORAZEPAM 1 MG PO TABS: 1.0000 mg | ORAL_TABLET | Freq: Four times a day (QID) | ORAL | Status: AC | PRN

## 2016-09-10 MED ORDER — CIPROFLOXACIN IN D5W 400 MG/200ML IV SOLN
400.0000 mg | Freq: Two times a day (BID) | INTRAVENOUS | Status: DC
  Administered 2016-09-10 – 2016-09-14 (×9): 400 mg via INTRAVENOUS
  Filled 2016-09-10 (×9): qty 200

## 2016-09-10 MED ORDER — MIDAZOLAM HCL 2 MG/2ML IJ SOLN
INTRAMUSCULAR | Status: AC
  Filled 2016-09-10: qty 4

## 2016-09-10 MED ORDER — MORPHINE SULFATE (PF) 4 MG/ML IV SOLN
2.0000 mg | INTRAVENOUS | Status: DC | PRN
  Administered 2016-09-10 – 2016-09-11 (×6): 2 mg via INTRAVENOUS
  Filled 2016-09-10 (×6): qty 1

## 2016-09-10 MED ORDER — ACETAMINOPHEN 650 MG RE SUPP: 650.0000 mg | Freq: Four times a day (QID) | RECTAL | Status: DC | PRN

## 2016-09-10 MED ORDER — LORAZEPAM 2 MG/ML IJ SOLN: 1.0000 mg | Freq: Four times a day (QID) | INTRAMUSCULAR | Status: AC | PRN

## 2016-09-10 MED ORDER — ZOLPIDEM TARTRATE 5 MG PO TABS
5.0000 mg | ORAL_TABLET | Freq: Every evening | ORAL | Status: DC | PRN
  Filled 2016-09-10: qty 1

## 2016-09-10 MED ORDER — ACETAMINOPHEN 325 MG PO TABS
650.0000 mg | ORAL_TABLET | Freq: Four times a day (QID) | ORAL | Status: DC | PRN
  Administered 2016-09-10: 650 mg via ORAL
  Filled 2016-09-10: qty 2

## 2016-09-10 MED ORDER — LORAZEPAM 2 MG/ML IJ SOLN
0.0000 mg | Freq: Four times a day (QID) | INTRAMUSCULAR | Status: DC
  Filled 2016-09-10: qty 2

## 2016-09-10 MED ORDER — CIPROFLOXACIN IN D5W 400 MG/200ML IV SOLN
400.0000 mg | Freq: Once | INTRAVENOUS | Status: DC
  Filled 2016-09-10: qty 200

## 2016-09-10 MED ORDER — FENTANYL CITRATE (PF) 100 MCG/2ML IJ SOLN
INTRAMUSCULAR | Status: AC | PRN
  Administered 2016-09-10 (×3): 25 ug via INTRAVENOUS
  Administered 2016-09-10 (×2): 50 ug via INTRAVENOUS

## 2016-09-10 MED ORDER — SODIUM CHLORIDE 0.9 % IV SOLN
INTRAVENOUS | Status: AC
  Administered 2016-09-10: 02:00:00 via INTRAVENOUS

## 2016-09-10 MED ORDER — THIAMINE HCL 100 MG/ML IJ SOLN
100.0000 mg | Freq: Every day | INTRAMUSCULAR | Status: DC
  Filled 2016-09-10: qty 2

## 2016-09-10 MED ORDER — ALBUTEROL SULFATE (2.5 MG/3ML) 0.083% IN NEBU: 2.5000 mg | INHALATION_SOLUTION | RESPIRATORY_TRACT | Status: DC | PRN

## 2016-09-10 NOTE — Consult Note (Signed)
Chief Complaint: Patient was seen in consultation today for pelvic abscess drain placement Chief Complaint  Patient presents with  . Abdominal Pain  . Diarrhea   at the request of Dr Mitchel HonourP Joseph  Referring Physician(s): Dr Mitchel HonourP Joseph  Supervising Physician: Simonne ComeWatts, John  [facility]  History of Present Illness: Daniel Berger is a 36 y.o. male   abd pain and diarrhea for few days Worsening pain Presented to ED last pm CT 1/12: IMPRESSION: There is a large abscess in the deep pelvis between the rectum and bladder. Diverticulosis of the sigmoid colon with thick wall of the rectum and sigmoid colon. Likely this represents diverticulitis with localized perforation leading to abscess formation.  Request for abscess drain placement Dr Grace IsaacWatts has reviewed imaging and approves procedure   Past Medical History:  Diagnosis Date  . Asthma   . Bronchitis     Past Surgical History:  Procedure Laterality Date  . SURGERY OF LIP      Allergies: Patient has no known allergies.  Medications: Prior to Admission medications   Medication Sig Start Date End Date Taking? Authorizing Provider  albuterol (PROVENTIL HFA;VENTOLIN HFA) 108 (90 Base) MCG/ACT inhaler Inhale 2 puffs into the lungs every 6 (six) hours as needed for wheezing or shortness of breath.   Yes Historical Provider, MD  Magnesium Hydroxide (MILK OF MAGNESIA PO) Take 60 mLs by mouth daily as needed (diarrhea).   Yes Historical Provider, MD     Family History  Problem Relation Age of Onset  . Diabetes type II Mother   . Diabetes type II Father   . Hypertension Father     Social History   Social History  . Marital status: Single    Spouse name: N/A  . Number of children: N/A  . Years of education: N/A   Social History Main Topics  . Smoking status: Former Games developermoker  . Smokeless tobacco: Never Used  . Alcohol use Yes  . Drug use: No  . Sexual activity: Not Asked   Other Topics Concern  .  None   Social History Narrative  . None    Review of Systems: A 12 point ROS discussed and pertinent positives are indicated in the HPI above.  All other systems are negative.  Review of Systems  Constitutional: Positive for activity change, appetite change, fatigue and fever.  Gastrointestinal: Positive for abdominal pain and diarrhea.  Psychiatric/Behavioral: Negative for behavioral problems and confusion.    Vital Signs: BP 113/69 (BP Location: Left Arm)   Pulse 65   Temp 98.6 F (37 C) (Oral)   Resp 17   Ht 5\' 7"  (1.702 m)   Wt 130 lb 8.2 oz (59.2 kg)   SpO2 98%   BMI 20.44 kg/m   Physical Exam  Constitutional: He is oriented to person, place, and time.  Cardiovascular: Normal rate and regular rhythm.   Pulmonary/Chest: Effort normal and breath sounds normal.  Abdominal: Soft. There is tenderness.  Musculoskeletal: Normal range of motion.  Neurological: He is alert and oriented to person, place, and time.  Skin: Skin is warm and dry.  Psychiatric: He has a normal mood and affect. His behavior is normal. Judgment and thought content normal.  Nursing note and vitals reviewed.   Mallampati Score:  MD Evaluation Airway: WNL Heart: WNL Abdomen: WNL Chest/ Lungs: WNL ASA  Classification: 2 Mallampati/Airway Score: One  Imaging: Ct Abdomen Pelvis W Contrast  Result Date: 09/09/2016 CLINICAL DATA:  Abdominal  pain for 1 week. EXAM: CT ABDOMEN AND PELVIS WITH CONTRAST TECHNIQUE: Multidetector CT imaging of the abdomen and pelvis was performed using the standard protocol following bolus administration of intravenous contrast. CONTRAST:  ISOVUE-300 IOPAMIDOL (ISOVUE-300) INJECTION 61% COMPARISON:  None. FINDINGS: Lower chest: Lung bases are clear. Hepatobiliary: No focal liver abnormality is seen. No gallstones, gallbladder wall thickening, or biliary dilatation. Pancreas: Unremarkable. No pancreatic ductal dilatation or surrounding inflammatory changes. Spleen:  Normal in size without focal abnormality. Adrenals/Urinary Tract: Adrenal glands are unremarkable. Kidneys are normal, without renal calculi, focal lesion, or hydronephrosis. Bladder is unremarkable. Stomach/Bowel: Stomach, small bowel, and colon are not abnormally distended. Diverticulosis of the sigmoid colon. Thickened wall of the sigmoid colon and rectum. Circumscribed fluid collection with thick wall and peripheral enhancement demonstrated in the space between the rectum and bladder. There is a tiny gas bubble. This is consistent with an abscess. This probably represents changes related to diverticulitis with localized perforation leading to a loculated abscess. The abscess cavity measures about 5.1 x 7.1 x 7.3 cm. The appendix is segmentally visualized and appears normal. Vascular/Lymphatic: No significant vascular findings are present. No enlarged abdominal or pelvic lymph nodes. Reproductive: Prostate is unremarkable. Other: No free air or free fluid in the abdomen. Abdominal wall musculature appears intact. Musculoskeletal: No acute or significant osseous findings. IMPRESSION: There is a large abscess in the deep pelvis between the rectum and bladder. Diverticulosis of the sigmoid colon with thick wall of the rectum and sigmoid colon. Likely this represents diverticulitis with localized perforation leading to abscess formation. Electronically Signed   By: Burman Nieves M.D.   On: 09/09/2016 22:44    Labs:  CBC:  Recent Labs  09/09/16 1317 09/10/16 0451  WBC 12.0* 10.7*  HGB 12.3* 12.1*  HCT 36.5* 36.1*  PLT 440* 444*    COAGS:  Recent Labs  09/10/16 0210  INR 1.30  APTT 37*    BMP:  Recent Labs  09/09/16 1317 09/10/16 0451  NA 137 135  K 4.3 3.9  CL 101 102  CO2 25 24  GLUCOSE 96 93  BUN 5* 6  CALCIUM 8.9 8.8*  CREATININE 1.02 0.84  GFRNONAA >60 >60  GFRAA >60 >60    LIVER FUNCTION TESTS:  Recent Labs  09/09/16 1317  BILITOT 0.4  AST 14*  ALT 22    ALKPHOS 57  PROT 7.6  ALBUMIN 3.0*    TUMOR MARKERS: No results for input(s): AFPTM, CEA, CA199, CHROMGRNA in the last 8760 hours.  Assessment and Plan:  sigmoid diverticular abscess Scheduled for drain placement in IR Risks and Benefits discussed with the patient including bleeding, infection, damage to adjacent structures, bowel perforation/fistula connection, and sepsis. All of the patient's questions were answered, patient is agreeable to proceed. Consent signed and in chart.   Thank you for this interesting consult.  I greatly enjoyed meeting Carel A Elenbaas and look forward to participating in their care.  A copy of this report was sent to the requesting provider on this date.  Electronically Signed: Ralene Muskrat A 09/10/2016, 12:10 PM   I spent a total of 40 Minutes    in face to face in clinical consultation, greater than 50% of which was counseling/coordinating care for pelvic abscess drain placement

## 2016-09-10 NOTE — H&P (Signed)
History and Physical    Daniel Berger:[id number] DOB: February 26, 1981 DOA: 09/09/2016  Referring MD/NP/PA:   PCP: No PCP Per Patient   Patient coming from:  The patient is coming from home.  At baseline, pt is independent for most of ADL.        Chief Complaint: Abdominal pain and diarrhea  HPI: Daniel Berger is a 36 y.o. male with medical history significant of asthma, alcohol abuse, who presents with abdominal pain diarrhea.  Patient states that he has been having abdominal pain diarrhea for about 1 week. He has 5-6 watery diarrhea each day. He has abdominal pain, which is located in the lower and middle abdomen, constant, 7 out of 10 in severity, nonradiating. He also complains of pain in anorectal area. Patient does not have nausea, vomiting. He has chills, no fever. He has light headedness. Denies chest pain, shortness breath, cough, symptoms of UTI or unilateral weakness. He states that he drinks liquor alcohol, every other day, pint of liquor each time.  ED Course: pt was found to have WBC 12.0, negative lipase, negative urinalysis, electrolytes renal function okay, temperature normal, no tachycardia, oxygen saturation 93% on room air. CT abdomen/pelvis showed a large abscess in the deep pelvis and possible diverticulitis with perforation.  Review of Systems:   General: no fevers, chills, no changes in body weight, has poor appetite, has fatigue HEENT: no blurry vision, hearing changes or sore throat Respiratory: no dyspnea, coughing, wheezing CV: no chest pain, no palpitations GI: no nausea, vomiting, has abdominal pain, diarrhea, no constipation GU: no dysuria, burning on urination, increased urinary frequency, hematuria  Ext: no leg edema Neuro: no unilateral weakness, numbness, or tingling, no vision change or hearing loss Skin: no rash, no skin tear. MSK: No muscle spasm, no deformity, no limitation of range of movement in spin Heme: No easy bruising.  Travel history:  No recent long distant travel.  Allergy: No Known Allergies  Past Medical History:  Diagnosis Date  . Asthma   . Bronchitis     Past Surgical History:  Procedure Laterality Date  . SURGERY OF LIP      Social History:  reports that he has never smoked. He does not have any smokeless tobacco history on file. He reports that he drinks alcohol. He reports that he does not use drugs.  Family History:  Family History  Problem Relation Age of Onset  . Diabetes type II Mother   . Diabetes type II Father   . Hypertension Father      Prior to Admission medications   Medication Sig Start Date End Date Taking? Authorizing Provider  albuterol (PROVENTIL HFA;VENTOLIN HFA) 108 (90 Base) MCG/ACT inhaler Inhale 2 puffs into the lungs every 6 (six) hours as needed for wheezing or shortness of breath.   Yes Historical Provider, MD  Magnesium Hydroxide (MILK OF MAGNESIA PO) Take 60 mLs by mouth daily as needed (diarrhea).   Yes Historical Provider, MD    Physical Exam: Vitals:   09/09/16 2100 09/09/16 2115 09/09/16 2200 09/10/16 0058  BP: 104/72 101/80 120/87 122/83  Pulse: 71 70 71 73  Resp:  16  16  Temp:      TempSrc:      SpO2: 93% 97% 97% 96%   General: Not in acute distress HEENT:       Eyes: PERRL, EOMI, no scleral icterus.       ENT: No discharge from the ears and nose, no pharynx injection, no tonsillar  enlargement.        Neck: No JVD, no bruit, no mass felt. Heme: No neck lymph node enlargement. Cardiac: S1/S2, RRR, No murmurs, No gallops or rubs. Respiratory: No rales, wheezing, rhonchi or rubs. GI: Soft, nondistended, tenderness in lower abdomen and anal area. no rebound pain, no organomegaly, BS present. GU: No hematuria Ext: No pitting leg edema bilaterally. 2+DP/PT pulse bilaterally. Musculoskeletal: No joint deformities, No joint redness or warmth, no limitation of ROM in spin. Skin: No rashes.  Neuro: Alert, oriented X3, cranial nerves II-XII grossly intact, moves  all extremities normally. Psych: Patient is not psychotic, no suicidal or hemocidal ideation.  Labs on Admission: I have personally reviewed following labs and imaging studies  CBC:  Recent Labs Lab 09/09/16 1317  WBC 12.0*  HGB 12.3*  HCT 36.5*  MCV 90.1  PLT 440*   Basic Metabolic Panel:  Recent Labs Lab 09/09/16 1317  NA 137  K 4.3  CL 101  CO2 25  GLUCOSE 96  BUN 5*  CREATININE 1.02  CALCIUM 8.9   GFR: CrCl cannot be calculated (Unknown ideal weight.). Liver Function Tests:  Recent Labs Lab 09/09/16 1317  AST 14*  ALT 22  ALKPHOS 57  BILITOT 0.4  PROT 7.6  ALBUMIN 3.0*    Recent Labs Lab 09/09/16 1317  LIPASE 25   No results for input(s): AMMONIA in the last 168 hours. Coagulation Profile: No results for input(s): INR, PROTIME in the last 168 hours. Cardiac Enzymes: No results for input(s): CKTOTAL, CKMB, CKMBINDEX, TROPONINI in the last 168 hours. BNP (last 3 results) No results for input(s): PROBNP in the last 8760 hours. HbA1C: No results for input(s): HGBA1C in the last 72 hours. CBG: No results for input(s): GLUCAP in the last 168 hours. Lipid Profile: No results for input(s): CHOL, HDL, LDLCALC, TRIG, CHOLHDL, LDLDIRECT in the last 72 hours. Thyroid Function Tests: No results for input(s): TSH, T4TOTAL, FREET4, T3FREE, THYROIDAB in the last 72 hours. Anemia Panel: No results for input(s): VITAMINB12, FOLATE, FERRITIN, TIBC, IRON, RETICCTPCT in the last 72 hours. Urine analysis:    Component Value Date/Time   COLORURINE YELLOW 09/09/2016 1301   APPEARANCEUR CLEAR 09/09/2016 1301   LABSPEC 1.018 09/09/2016 1301   PHURINE 5.0 09/09/2016 1301   GLUCOSEU NEGATIVE 09/09/2016 1301   HGBUR NEGATIVE 09/09/2016 1301   BILIRUBINUR NEGATIVE 09/09/2016 1301   KETONESUR NEGATIVE 09/09/2016 1301   PROTEINUR NEGATIVE 09/09/2016 1301   NITRITE NEGATIVE 09/09/2016 1301   LEUKOCYTESUR NEGATIVE 09/09/2016 1301   Sepsis  Labs: @LABRCNTIP (procalcitonin:4,lacticidven:4) )No results found for this or any previous visit (from the past 240 hour(s)).   Radiological Exams on Admission: Ct Abdomen Pelvis W Contrast  Result Date: 09/09/2016 CLINICAL DATA:  Abdominal pain for 1 week. EXAM: CT ABDOMEN AND PELVIS WITH CONTRAST TECHNIQUE: Multidetector CT imaging of the abdomen and pelvis was performed using the standard protocol following bolus administration of intravenous contrast. CONTRAST:  100mL ISOVUE-300 IOPAMIDOL (ISOVUE-300) INJECTION 61% COMPARISON:  None. FINDINGS: Lower chest: Lung bases are clear. Hepatobiliary: No focal liver abnormality is seen. No gallstones, gallbladder wall thickening, or biliary dilatation. Pancreas: Unremarkable. No pancreatic ductal dilatation or surrounding inflammatory changes. Spleen: Normal in size without focal abnormality. Adrenals/Urinary Tract: Adrenal glands are unremarkable. Kidneys are normal, without renal calculi, focal lesion, or hydronephrosis. Bladder is unremarkable. Stomach/Bowel: Stomach, small bowel, and colon are not abnormally distended. Diverticulosis of the sigmoid colon. Thickened wall of the sigmoid colon and rectum. Circumscribed fluid collection with thick wall and  peripheral enhancement demonstrated in the space between the rectum and bladder. There is a tiny gas bubble. This is consistent with an abscess. This probably represents changes related to diverticulitis with localized perforation leading to a loculated abscess. The abscess cavity measures about 5.1 x 7.1 x 7.3 cm. The appendix is segmentally visualized and appears normal. Vascular/Lymphatic: No significant vascular findings are present. No enlarged abdominal or pelvic lymph nodes. Reproductive: Prostate is unremarkable. Other: No free air or free fluid in the abdomen. Abdominal wall musculature appears intact. Musculoskeletal: No acute or significant osseous findings. IMPRESSION: There is a large abscess in the  deep pelvis between the rectum and bladder. Diverticulosis of the sigmoid colon with thick wall of the rectum and sigmoid colon. Likely this represents diverticulitis with localized perforation leading to abscess formation. Electronically Signed   By: Burman Nieves M.D.   On: 09/09/2016 22:44     EKG:  Not done in ED, will get one.   Assessment/Plan Principal Problem:   Abscess of male pelvis (HCC) Active Problems:   Asthma   Alcohol use   Diverticulitis of large intestine with abscess without bleeding   Abscess of male pelvis possibly due to diverticulitis with perforation: Patient has leukocytosis, but no fever. Clinically nonseptic. Hemodynamically stable. General surgery was consulted (ED physician could not recall surgeries name).   -will admit to tele bed - npo  - prn morphine for pain  - prn Zofran for nausea  - Will check C diff pcr - Blood culture x 2 - IV antibiotics: Flagyl plus Cipro - will get Procalcitonin and trend lactic acid level per sepsis protocol - IVF: 2.5 L of NS bolus in ED, followed by 100 cc/h - f/u General surgeon's recommendation.  Asthma: stable -Prn albuterol nebs  Alcohol use: -Did counseling about the importance of quitting drinking -CIWA protocol  DVT ppx: SCD Code Status: Full code Family Communication: Yes, patient's brother at bed side Disposition Plan:  Anticipate discharge back to previous home environment Consults called: General surgery was consulted  Admission status: Medical floor/inpt     Date of Service 09/10/2016    Lorretta Harp [facility] Pager [phone]  If 7PM-7AM, please contact night-coverage [email or web address] Password [email or web address] 09/10/2016, 1:21 AM

## 2016-09-10 NOTE — Progress Notes (Signed)
Pharmacy Antibiotic Note  Daniel Berger is a 36 y.o. male admitted on 09/09/2016 with pelvic abscess 2/2 localized perforation.  Pharmacy has been consulted for Cipro dosing.  Plan: Cipro 400mg  IV Q12H.  Temp (24hrs), Avg:98.4 F (36.9 C), Min:98.4 F (36.9 C), Max:98.4 F (36.9 C)   Recent Labs Lab 09/09/16 1317  WBC 12.0*  CREATININE 1.02     No Known Allergies   Thank you for allowing pharmacy to be a part of this patient's care.  Vernard GamblesVeronda Xiomara Sevillano, PharmD, BCPS  09/10/2016 12:18 AM

## 2016-09-10 NOTE — Progress Notes (Signed)
Patient ID: Daniel Berger, male   DOB: 1980-11-20, 36 y.o.   MRN: [id number]  [facility] Progress Note     Subjective: Patient with multiple loose BM's over night. Slightly less abdominal pain this morning. Denies n/v.  Objective: Vital signs in last 24 hours: Temp:  [98.4 F (36.9 C)-98.7 F (37.1 C)] 98.6 F (37 C) (01/13 0455) Pulse Rate:  [65-90] 65 (01/13 0455) Resp:  [14-17] 17 (01/13 0455) BP: (101-131)/(68-98) 113/69 (01/13 0455) SpO2:  [93 %-99 %] 98 % (01/13 0455) Weight:  [130 lb 8.2 oz (59.2 kg)] 130 lb 8.2 oz (59.2 kg) (01/13 0152) Last BM Date: 09/09/16  Intake/Output from previous day: 01/12 0701 - 01/13 0700 In: 181.7 [I.V.:81.7; IV Piggyback:100] Out: -  Intake/Output this shift: No intake/output data recorded.  PE: Gen:  Alert, NAD, pleasant Card:  RRR, no M/G/R heard Pulm:  CTAB, no W/R/R, effort normal Abd: Soft, NT, +BS, no HSM, mild lower abdominal tenderness (R, L, and suprapubic) Ext:  No erythema, edema, or tenderness   Lab Results:   Recent Labs  09/09/16 1317 09/10/16 0451  WBC 12.0* 10.7*  HGB 12.3* 12.1*  HCT 36.5* 36.1*  PLT 440* 444*   BMET  Recent Labs  09/09/16 1317 09/10/16 0451  NA 137 135  K 4.3 3.9  CL 101 102  CO2 25 24  GLUCOSE 96 93  BUN 5* 6  CREATININE 1.02 0.84  CALCIUM 8.9 8.8*   PT/INR  Recent Labs  09/10/16 0210  LABPROT 16.3*  INR 1.30   CMP     Component Value Date/Time   NA 135 09/10/2016 0451   K 3.9 09/10/2016 0451   CL 102 09/10/2016 0451   CO2 24 09/10/2016 0451   GLUCOSE 93 09/10/2016 0451   BUN 6 09/10/2016 0451   CREATININE 0.84 09/10/2016 0451   CALCIUM 8.8 (L) 09/10/2016 0451   PROT 7.6 09/09/2016 1317   ALBUMIN 3.0 (L) 09/09/2016 1317   AST 14 (L) 09/09/2016 1317   ALT 22 09/09/2016 1317   ALKPHOS 57 09/09/2016 1317   BILITOT 0.4 09/09/2016 1317   GFRNONAA >60 09/10/2016 0451   GFRAA >60 09/10/2016 0451   Lipase     Component Value Date/Time   LIPASE 25  09/09/2016 1317       Studies/Results: Ct Abdomen Pelvis W Contrast  Result Date: 09/09/2016 CLINICAL DATA:  Abdominal pain for 1 week. EXAM: CT ABDOMEN AND PELVIS WITH CONTRAST TECHNIQUE: Multidetector CT imaging of the abdomen and pelvis was performed using the standard protocol following bolus administration of intravenous contrast. CONTRAST:  ISOVUE-300 IOPAMIDOL (ISOVUE-300) INJECTION 61% COMPARISON:  None. FINDINGS: Lower chest: Lung bases are clear. Hepatobiliary: No focal liver abnormality is seen. No gallstones, gallbladder wall thickening, or biliary dilatation. Pancreas: Unremarkable. No pancreatic ductal dilatation or surrounding inflammatory changes. Spleen: Normal in size without focal abnormality. Adrenals/Urinary Tract: Adrenal glands are unremarkable. Kidneys are normal, without renal calculi, focal lesion, or hydronephrosis. Bladder is unremarkable. Stomach/Bowel: Stomach, small bowel, and colon are not abnormally distended. Diverticulosis of the sigmoid colon. Thickened wall of the sigmoid colon and rectum. Circumscribed fluid collection with thick wall and peripheral enhancement demonstrated in the space between the rectum and bladder. There is a tiny gas bubble. This is consistent with an abscess. This probably represents changes related to diverticulitis with localized perforation leading to a loculated abscess. The abscess cavity measures about 5.1 x 7.1 x 7.3 cm. The appendix is segmentally visualized and appears normal. Vascular/Lymphatic:  No significant vascular findings are present. No enlarged abdominal or pelvic lymph nodes. Reproductive: Prostate is unremarkable. Other: No free air or free fluid in the abdomen. Abdominal wall musculature appears intact. Musculoskeletal: No acute or significant osseous findings. IMPRESSION: There is a large abscess in the deep pelvis between the rectum and bladder. Diverticulosis of the sigmoid colon with thick wall of the rectum and  sigmoid colon. Likely this represents diverticulitis with localized perforation leading to abscess formation. Electronically Signed   By: Burman NievesWilliam  Stevens M.D.   On: 09/09/2016 22:44    Anti-infectives: Anti-infectives    Start     Dose/Rate Route Frequency Ordered Stop   09/10/16 1200  ciprofloxacin (CIPRO) IVPB 400 mg     400 mg 200 mL/hr over 60 Minutes Intravenous Every 12 hours 09/10/16 0023     09/10/16 0030  metroNIDAZOLE (FLAGYL) IVPB 500 mg     500 mg 100 mL/hr over 60 Minutes Intravenous Every 8 hours 09/10/16 0017     09/10/16 0030  ciprofloxacin (CIPRO) IVPB 400 mg  Status:  Discontinued     400 mg 200 mL/hr over 60 Minutes Intravenous  Once 09/10/16 0023 09/10/16 0050   09/10/16 0000  piperacillin-tazobactam (ZOSYN) IVPB 3.375 g  Status:  Discontinued     3.375 g 12.5 mL/hr over 240 Minutes Intravenous  Once 09/09/16 2355 09/10/16 0016       Assessment/Plan Sigmoid diverticulitis with abscess - CT shows sigmoid diverticulitis with localized perforation and a large abscess in the deep pelvis between the rectum and bladder - WBC down today, 10.7 from 12; afebrile - C diff and blood cultures pending  Alcohol use - CIWA Asthma - PRN albuterol nebs  ID - IV cipro/flagyl 1/13>> FEN - IVF, NPO VTE - SCDs  Plan - awaiting IR to eval for possible drainage of abscess. Continue NPO and IV antibiotics. CBC in AM   LOS: 0 days    Edson SnowballBROOKE A MILLER , [facility] 09/10/2016, 7:51 AM Pager: [phone] Consults: [phone] Mon-Fri 7:00 am-4:30 pm Sat-Sun 7:00 am-11:30 am

## 2016-09-10 NOTE — Progress Notes (Signed)
Pt seen and examined, admitted earlier this morning by Dr.Niu 35/M with history of asthma/alcohol abuse admitted with abdominal pain and diarrhea for 1-2 weeks 1. Acute diverticulitis with perforation and large abscess in the deep pelvis -Continue IV ciprofloxacin and Flagyl, NPO, IV fluids -[facility] consulting -IR consulted for percutaneous abscess drainage -Labs in a.m.  2. Alcohol abuse -Counseled, continue thiamine, monitor on CIWA protocol, no evidence of withdrawal yet  Daniel CovePreetha Jhonny Calixto, MD

## 2016-09-10 NOTE — Sedation Documentation (Signed)
Patient is resting comfortably. 

## 2016-09-10 NOTE — Sedation Documentation (Signed)
Vital signs stable. No complaints of pain at this time. 

## 2016-09-10 NOTE — Procedures (Signed)
Technically successful CT guided placed of a 10 Fr drainage catheter placement into the pelvic abscess via L anterior lower abd/pelvic approach yielding 160 cc of purulent, foul smelling fluid.   An aspirated sample was sent to the laboratory for analysis.   EBL: Minimal No immediate post procedural complications.   Katherina RightJay Chaya Dehaan, MD Pager #: [phone]

## 2016-09-10 NOTE — Consult Note (Signed)
Reason for Consult:diveritculitis with abscess Referring Physician: dr Reita Cliche is an 36 y.o. male.  HPI: 36 yo AAM with 8 day history of lower abd discomfort, sensation of incomplete defecation, and irregular stools. Came to ED for worsening pain. Subjective chills. Feels like can't completely empty rectum and most recently had increased rectal discomfort and is having to lean toward left side. Been trying to take a laxative after eating with no improvement. Feels bloated. No melena/hematochezia. Denies vomiting, family hx of colon issues, cancer. No dysuria.   Smokes cigarettes occasionally. Generally drinks about a pint daily but hasn't been drinking since pain started.   Past Medical History:  Diagnosis Date  . Asthma   . Bronchitis     No past surgical history on file.  No family history on file.  Social History:  reports that he has never smoked. He does not have any smokeless tobacco history on file. He reports that he drinks alcohol. His drug history is not on file.  Allergies: No Known Allergies  Medications: I have reviewed the patient's current medications.  Results for orders placed or performed during the hospital encounter of 09/09/16 (from the past 48 hour(s))  Urinalysis, Routine w reflex microscopic     Status: None   Collection Time: 09/09/16  1:01 PM  Result Value Ref Range   Color, Urine YELLOW YELLOW   APPearance CLEAR CLEAR   Specific Gravity, Urine 1.018 1.005 - 1.030   pH 5.0 5.0 - 8.0   Glucose, UA NEGATIVE NEGATIVE mg/dL   Hgb urine dipstick NEGATIVE NEGATIVE   Bilirubin Urine NEGATIVE NEGATIVE   Ketones, ur NEGATIVE NEGATIVE mg/dL   Protein, ur NEGATIVE NEGATIVE mg/dL   Nitrite NEGATIVE NEGATIVE   Leukocytes, UA NEGATIVE NEGATIVE  Lipase, blood     Status: None   Collection Time: 09/09/16  1:17 PM  Result Value Ref Range   Lipase 25 11 - 51 U/L  Comprehensive metabolic panel     Status: Abnormal   Collection Time: 09/09/16  1:17 PM   Result Value Ref Range   Sodium 137 135 - 145 mmol/L   Potassium 4.3 3.5 - 5.1 mmol/L   Chloride 101 101 - 111 mmol/L   CO2 25 22 - 32 mmol/L   Glucose, Bld 96 65 - 99 mg/dL   BUN 5 (L) 6 - 20 mg/dL   Creatinine, Ser 1.02 0.61 - 1.24 mg/dL   Calcium 8.9 8.9 - 10.3 mg/dL   Total Protein 7.6 6.5 - 8.1 g/dL   Albumin 3.0 (L) 3.5 - 5.0 g/dL   AST 14 (L) 15 - 41 U/L   ALT 22 17 - 63 U/L   Alkaline Phosphatase 57 38 - 126 U/L   Total Bilirubin 0.4 0.3 - 1.2 mg/dL   GFR calc non Af Amer >60 >60 mL/min   GFR calc Af Amer >60 >60 mL/min    Comment: (NOTE) The eGFR has been calculated using the CKD EPI equation. This calculation has not been validated in all clinical situations. eGFR's persistently <60 mL/min signify possible Chronic Kidney Disease.    Anion gap 11 5 - 15  CBC     Status: Abnormal   Collection Time: 09/09/16  1:17 PM  Result Value Ref Range   WBC 12.0 (H) 4.0 - 10.5 K/uL   RBC 4.05 (L) 4.22 - 5.81 MIL/uL   Hemoglobin 12.3 (L) 13.0 - 17.0 g/dL   HCT 36.5 (L) 39.0 - 52.0 %   MCV 90.1  78.0 - 100.0 fL   MCH 30.4 26.0 - 34.0 pg   MCHC 33.7 30.0 - 36.0 g/dL   RDW 12.6 11.5 - 15.5 %   Platelets 440 (H) 150 - 400 K/uL    Ct Abdomen Pelvis W Contrast  Result Date: 09/09/2016 CLINICAL DATA:  Abdominal pain for 1 week. EXAM: CT ABDOMEN AND PELVIS WITH CONTRAST TECHNIQUE: Multidetector CT imaging of the abdomen and pelvis was performed using the standard protocol following bolus administration of intravenous contrast. CONTRAST:  171m ISOVUE-300 IOPAMIDOL (ISOVUE-300) INJECTION 61% COMPARISON:  None. FINDINGS: Lower chest: Lung bases are clear. Hepatobiliary: No focal liver abnormality is seen. No gallstones, gallbladder wall thickening, or biliary dilatation. Pancreas: Unremarkable. No pancreatic ductal dilatation or surrounding inflammatory changes. Spleen: Normal in size without focal abnormality. Adrenals/Urinary Tract: Adrenal glands are unremarkable. Kidneys are normal,  without renal calculi, focal lesion, or hydronephrosis. Bladder is unremarkable. Stomach/Bowel: Stomach, small bowel, and colon are not abnormally distended. Diverticulosis of the sigmoid colon. Thickened wall of the sigmoid colon and rectum. Circumscribed fluid collection with thick wall and peripheral enhancement demonstrated in the space between the rectum and bladder. There is a tiny gas bubble. This is consistent with an abscess. This probably represents changes related to diverticulitis with localized perforation leading to a loculated abscess. The abscess cavity measures about 5.1 x 7.1 x 7.3 cm. The appendix is segmentally visualized and appears normal. Vascular/Lymphatic: No significant vascular findings are present. No enlarged abdominal or pelvic lymph nodes. Reproductive: Prostate is unremarkable. Other: No free air or free fluid in the abdomen. Abdominal wall musculature appears intact. Musculoskeletal: No acute or significant osseous findings. IMPRESSION: There is a large abscess in the deep pelvis between the rectum and bladder. Diverticulosis of the sigmoid colon with thick wall of the rectum and sigmoid colon. Likely this represents diverticulitis with localized perforation leading to abscess formation. Electronically Signed   By: WLucienne CapersM.D.   On: 09/09/2016 22:44    Review of Systems  Constitutional: Positive for chills. Negative for weight loss.  HENT: Negative for nosebleeds.   Eyes: Negative for blurred vision.  Respiratory: Negative for shortness of breath.   Cardiovascular: Negative for chest pain, palpitations, orthopnea and PND.       Denies DOE  Gastrointestinal: Positive for abdominal pain and constipation.  Genitourinary: Negative for dysuria, frequency, hematuria and urgency.  Musculoskeletal: Negative.   Skin: Negative for itching and rash.  Neurological: Negative for dizziness, focal weakness, seizures, loss of consciousness and headaches.    Endo/Heme/Allergies: Does not bruise/bleed easily.  Psychiatric/Behavioral: The patient is not nervous/anxious.    Blood pressure 101/80, pulse 70, temperature 98.4 F (36.9 C), temperature source Oral, resp. rate 16, SpO2 97 %. Physical Exam  Vitals reviewed. Constitutional: He is oriented to person, place, and time. He appears well-developed and well-nourished. No distress.  HENT:  Head: Normocephalic and atraumatic.  Right Ear: External ear normal.  Left Ear: External ear normal.  Eyes: Conjunctivae are normal. No scleral icterus.  Neck: Normal range of motion. Neck supple. No tracheal deviation present. No thyromegaly present.  Cardiovascular: Normal rate and normal heart sounds.   Respiratory: Effort normal and breath sounds normal. No stridor. No respiratory distress. He has no wheezes.  GI: Soft. There is tenderness. There is no rigidity and no rebound.    Some mild distension. Some voluntary guarding. No rebound/peritonitis. TTP in suprapubic/LLQ.   Musculoskeletal: He exhibits no edema or tenderness.  Lymphadenopathy:    He has no  cervical adenopathy.  Neurological: He is alert and oriented to person, place, and time. He exhibits normal muscle tone.  Skin: Skin is warm and dry. No rash noted. He is not diaphoretic. No erythema. No pallor.  Psychiatric: He has a normal mood and affect. His behavior is normal. Judgment and thought content normal.    Assessment/Plan: Complicated sigmoid diverticulitis with abscess Alcohol use  No peritonitis. No fever. No tachycardia. No free air.  rec bowel rest (can have ice chips), IV abx (zosyn), perc drain by IR Discussed diverticular dis with pt and family. Discussed that generally this will improve with txt regimen described above. Discussed that sometimes it worsens or fails to improve which would require operative intervention.   Will follow  Leighton Ruff. Redmond Pulling, MD, FACS General, Bariatric, & Minimally Invasive Surgery [facility], [place]   [facility] M 09/10/2016, 12:43 AM

## 2016-09-10 NOTE — Sedation Documentation (Signed)
Pt is resting at this time with no complaints. Procedure started. 

## 2016-09-10 NOTE — Sedation Documentation (Signed)
Pt tolerated procedure well. serous fluid emptied from drain.

## 2016-09-10 NOTE — Sedation Documentation (Signed)
Pt grimacing in pain as drain is inserted

## 2016-09-10 NOTE — Sedation Documentation (Signed)
Pt grimacing in pain 

## 2016-09-11 LAB — BASIC METABOLIC PANEL
ANION GAP: 10 (ref 5–15)
BUN: 8 mg/dL (ref 6–20)
CHLORIDE: 102 mmol/L (ref 101–111)
CO2: 23 mmol/L (ref 22–32)
Calcium: 8.6 mg/dL — ABNORMAL LOW (ref 8.9–10.3)
Creatinine, Ser: 1.09 mg/dL (ref 0.61–1.24)
GFR calc non Af Amer: >60 mL/min (ref >60)
GLUCOSE: 82 mg/dL (ref 65–99)
POTASSIUM: 3.8 mmol/L (ref 3.5–5.1)
Sodium: 135 mmol/L (ref 135–145)

## 2016-09-11 LAB — CBC
HEMATOCRIT: 35.7 % — AB (ref 39.0–52.0)
HEMOGLOBIN: 11.9 g/dL — AB (ref 13.0–17.0)
MCH: 30.2 pg (ref 26.0–34.0)
MCHC: 33.3 g/dL (ref 30.0–36.0)
MCV: 90.6 fL (ref 78.0–100.0)
Platelets: 411 10*3/uL — ABNORMAL HIGH (ref 150–400)
RBC: 3.94 MIL/uL — AB (ref 4.22–5.81)
RDW: 12.6 % (ref 11.5–15.5)
WBC: 11.1 10*3/uL — ABNORMAL HIGH (ref 4.0–10.5)

## 2016-09-11 LAB — GLUCOSE, CAPILLARY: GLUCOSE-CAPILLARY: 77 mg/dL (ref 65–99)

## 2016-09-11 MED ORDER — SODIUM CHLORIDE 0.45 % IV SOLN
INTRAVENOUS | Status: DC
  Administered 2016-09-11: 09:00:00 via INTRAVENOUS
  Filled 2016-09-11 (×3): qty 1000

## 2016-09-11 MED ORDER — ENOXAPARIN SODIUM 40 MG/0.4ML ~~LOC~~ SOLN
40.0000 mg | SUBCUTANEOUS | Status: DC
  Administered 2016-09-11 – 2016-09-15 (×5): 40 mg via SUBCUTANEOUS
  Filled 2016-09-11 (×6): qty 0.4

## 2016-09-11 MED ORDER — SODIUM CHLORIDE 0.9 % IV SOLN
INTRAVENOUS | Status: DC
  Administered 2016-09-11 – 2016-09-13 (×4): via INTRAVENOUS

## 2016-09-11 NOTE — Progress Notes (Signed)
Referring Physician(s): Dr Mitchel Honour  Supervising Physician: Simonne Come  Patient Status:  [facility] - In-pt  Chief Complaint:  Pelvic abscess   Subjective:  1/13: Technically successful CT guided placed of a 10 Fr drainage catheter placement into the pelvic abscess via L anterior lower abd/pelvic approach yielding 160 cc of purulent, foul smelling fluid  Feels some better today Draining well  Allergies: Patient has no known allergies.  Medications: Prior to Admission medications   Medication Sig Start Date End Date Taking? Authorizing Provider  albuterol (PROVENTIL HFA;VENTOLIN HFA) 108 (90 Base) MCG/ACT inhaler Inhale 2 puffs into the lungs every 6 (six) hours as needed for wheezing or shortness of breath.   Yes Historical Provider, MD  Magnesium Hydroxide (MILK OF MAGNESIA PO) Take 60 mLs by mouth daily as needed (diarrhea).   Yes Historical Provider, MD     Vital Signs: BP 113/69 (BP Location: Left Arm)   Pulse 66   Temp 97.8 F (36.6 C) (Oral)   Resp 19   Ht 5\' 7"  (1.702 m)   Wt 130 lb 8.2 oz (59.2 kg)   SpO2 100%   BMI 20.44 kg/m   Physical Exam  Constitutional: He is oriented to person, place, and time.  Abdominal: Soft. Bowel sounds are normal.  Neurological: He is alert and oriented to person, place, and time.  Skin: Skin is warm.  Site of drain is clean and dry NT No bleeding Output 245 cc yesterday Bloody fluid in drain now Abundant Gr + and Gr -  Tmax 102 last pm  Nursing note and vitals reviewed.   Imaging: Ct Abdomen Pelvis W Contrast  Result Date: 09/09/2016 CLINICAL DATA:  Abdominal pain for 1 week. EXAM: CT ABDOMEN AND PELVIS WITH CONTRAST TECHNIQUE: Multidetector CT imaging of the abdomen and pelvis was performed using the standard protocol following bolus administration of intravenous contrast. CONTRAST:  ISOVUE-300 IOPAMIDOL (ISOVUE-300) INJECTION 61% COMPARISON:  None. FINDINGS: Lower chest: Lung bases are clear. Hepatobiliary:  No focal liver abnormality is seen. No gallstones, gallbladder wall thickening, or biliary dilatation. Pancreas: Unremarkable. No pancreatic ductal dilatation or surrounding inflammatory changes. Spleen: Normal in size without focal abnormality. Adrenals/Urinary Tract: Adrenal glands are unremarkable. Kidneys are normal, without renal calculi, focal lesion, or hydronephrosis. Bladder is unremarkable. Stomach/Bowel: Stomach, small bowel, and colon are not abnormally distended. Diverticulosis of the sigmoid colon. Thickened wall of the sigmoid colon and rectum. Circumscribed fluid collection with thick wall and peripheral enhancement demonstrated in the space between the rectum and bladder. There is a tiny gas bubble. This is consistent with an abscess. This probably represents changes related to diverticulitis with localized perforation leading to a loculated abscess. The abscess cavity measures about 5.1 x 7.1 x 7.3 cm. The appendix is segmentally visualized and appears normal. Vascular/Lymphatic: No significant vascular findings are present. No enlarged abdominal or pelvic lymph nodes. Reproductive: Prostate is unremarkable. Other: No free air or free fluid in the abdomen. Abdominal wall musculature appears intact. Musculoskeletal: No acute or significant osseous findings. IMPRESSION: There is a large abscess in the deep pelvis between the rectum and bladder. Diverticulosis of the sigmoid colon with thick wall of the rectum and sigmoid colon. Likely this represents diverticulitis with localized perforation leading to abscess formation. Electronically Signed   By: Burman Nieves M.D.   On: 09/09/2016 22:44   Ct Image Guided Drainage By Percutaneous Catheter  Result Date: 09/10/2016 INDICATION: Diverticular abscess. Please perform CT-guided percutaneous drainage catheter placement for infection source  control. EXAM: CT IMAGE GUIDED DRAINAGE BY PERCUTANEOUS CATHETER COMPARISON:  CT abdomen and pelvis -  07/10/2017 MEDICATIONS: The patient is currently admitted to the hospital and receiving intravenous antibiotics. The antibiotics were administered within an appropriate time frame prior to the initiation of the procedure. ANESTHESIA/SEDATION: Moderate (conscious) sedation was employed during this procedure. A total of Versed 3.5 mg and Fentanyl 175 mcg was administered intravenously. Moderate Sedation Time: 35 minutes. The patient's level of consciousness and vital signs were monitored continuously by radiology nursing throughout the procedure under my direct supervision. CONTRAST:  None COMPLICATIONS: None immediate. PROCEDURE: Informed written consent was obtained from the patient after a discussion of the risks, benefits and alternatives to treatment. The patient was placed supine on the CT gantry and a pre procedural CT was performed re-demonstrating the known abscess/fluid collection within the lower pelvis with dominant component measuring approximately 7.4 x 5.1 cm (image 32, series 8). The procedure was planned. A timeout was performed prior to the initiation of the procedure. The skin overlying the anterior aspect of the left lower abdomen/pelvis was prepped and draped in the usual sterile fashion. The overlying soft tissues were anesthetized with 1% lidocaine with epinephrine. Appropriate trajectory was planned with the use of a 22 gauge spinal needle. An 18 gauge trocar needle was advanced into the abscess/fluid collection and a short Amplatz super stiff wire was coiled within the collection. Appropriate positioning was confirmed with a limited CT scan. The tract was serially dilated however initially there was difficulty advancing the percutaneous drainage catheter into the well-formed pelvic abscess. Ultimately, initial attempt at drainage catheter placement proved unsuccessful. Again, an 18 gauge trocar needle was advanced into the abscess/fluid collection and a short Amplatz super stiff wire was  coiled within the collection. Appropriate positioning was confirmed with a limited CT scan. The tract was serially dilated allowing placement of a 10 [place] all-purpose drainage catheter. Appropriate positioning was confirmed with a limited postprocedural CT scan. Approximately 300 cc of purulent, foul smelling fluid fluid was aspirated. The tube was connected to a drainage bag and sutured in place. A dressing was placed. The patient tolerated the procedure well without immediate post procedural complication. IMPRESSION: Successful CT guided placement of a 10 French all purpose drain catheter into the pelvic diverticular abscess via anterior left lower abdominal/pelvic approach with aspiration of 300 cc of purulent, foul smelling fluid. Samples were sent to the laboratory as requested by the ordering clinical team. Electronically Signed   By: Simonne ComeJohn  Watts M.D.   On: 09/10/2016 14:59    Labs:  CBC:  Recent Labs  09/09/16 1317 09/10/16 0451 09/11/16 0315  WBC 12.0* 10.7* 11.1*  HGB 12.3* 12.1* 11.9*  HCT 36.5* 36.1* 35.7*  PLT 440* 444* 411*    COAGS:  Recent Labs  09/10/16 0210  INR 1.30  APTT 37*    BMP:  Recent Labs  09/09/16 1317 09/10/16 0451 09/11/16 0315  NA 137 135 135  K 4.3 3.9 3.8  CL 101 102 102  CO2 25 24 23   GLUCOSE 96 93 82  BUN 5* 6 8  CALCIUM 8.9 8.8* 8.6*  CREATININE 1.02 0.84 1.09  GFRNONAA >60 >60 >60  GFRAA >60 >60 >60    LIVER FUNCTION TESTS:  Recent Labs  09/09/16 1317  BILITOT 0.4  AST 14*  ALT 22  ALKPHOS 57  PROT 7.6  ALBUMIN 3.0*    Assessment and Plan:  Pelvic abscess drain intact Will follow   Electronically Signed: [email or web address],EAVWUJTURPIN,Shakaria Raphael  A 09/11/2016, 10:23 AM   I spent a total of 15 Minutes at the the patient's bedside AND on the patient's hospital floor or unit, greater than 50% of which was counseling/coordinating care for pelvic abscess drain

## 2016-09-11 NOTE — Progress Notes (Signed)
Patient ID: Daniel Berger, male   DOB: 02/25/1981, 36 y.o.   MRN: [id number]  [facility]     Subjective: S/p IR drain placement yesterday, 160cc of purulent/foul smelling fluid was yielded. States that he has a little less abdominal pain today compared to yesterday. He had multiple BM's yesterday. Denies n/v. He did have a temp of 102 over night.  Objective: Vital signs in last 24 hours: Temp:  [97.3 F (36.3 C)-102 F (38.9 C)] 97.8 F (36.6 C) (01/14 0502) Pulse Rate:  [66-100] 66 (01/14 0502) Resp:  [13-20] 19 (01/14 0502) BP: (111-130)/(31-93) 113/69 (01/14 0502) SpO2:  [90 %-100 %] 100 % (01/14 0502) Last BM Date: 09/10/16  Intake/Output from previous day: 01/13 0701 - 01/14 0700 In: 350 [P.O.:45; IV Piggyback:300] Out: 2245 [Urine:2000; Drains:245] Intake/Output this shift: No intake/output data recorded.  PE: Gen:  Alert, NAD, pleasant Card:  RRR, no M/G/R heard Pulm:  CTAB, no W/R/R, effort normal Abd: Soft, mild distension, +BS, no HSM, mild LLQ and suprapubic tenderness Ext:  No erythema, edema, or tenderness   Lab Results:   Recent Labs  09/10/16 0451 09/11/16 0315  WBC 10.7* 11.1*  HGB 12.1* 11.9*  HCT 36.1* 35.7*  PLT 444* 411*   BMET  Recent Labs  09/10/16 0451 09/11/16 0315  NA 135 135  K 3.9 3.8  CL 102 102  CO2 24 23  GLUCOSE 93 82  BUN 6 8  CREATININE 0.84 1.09  CALCIUM 8.8* 8.6*   PT/INR  Recent Labs  09/10/16 0210  LABPROT 16.3*  INR 1.30   CMP     Component Value Date/Time   NA 135 09/11/2016 0315   K 3.8 09/11/2016 0315   CL 102 09/11/2016 0315   CO2 23 09/11/2016 0315   GLUCOSE 82 09/11/2016 0315   BUN 8 09/11/2016 0315   CREATININE 1.09 09/11/2016 0315   CALCIUM 8.6 (L) 09/11/2016 0315   PROT 7.6 09/09/2016 1317   ALBUMIN 3.0 (L) 09/09/2016 1317   AST 14 (L) 09/09/2016 1317   ALT 22 09/09/2016 1317   ALKPHOS 57 09/09/2016 1317   BILITOT 0.4 09/09/2016 1317   GFRNONAA >60  09/11/2016 0315   GFRAA >60 09/11/2016 0315   Lipase     Component Value Date/Time   LIPASE 25 09/09/2016 1317       Studies/Results: Ct Abdomen Pelvis W Contrast  Result Date: 09/09/2016 CLINICAL DATA:  Abdominal pain for 1 week. EXAM: CT ABDOMEN AND PELVIS WITH CONTRAST TECHNIQUE: Multidetector CT imaging of the abdomen and pelvis was performed using the standard protocol following bolus administration of intravenous contrast. CONTRAST:  100mL ISOVUE-300 IOPAMIDOL (ISOVUE-300) INJECTION 61% COMPARISON:  None. FINDINGS: Lower chest: Lung bases are clear. Hepatobiliary: No focal liver abnormality is seen. No gallstones, gallbladder wall thickening, or biliary dilatation. Pancreas: Unremarkable. No pancreatic ductal dilatation or surrounding inflammatory changes. Spleen: Normal in size without focal abnormality. Adrenals/Urinary Tract: Adrenal glands are unremarkable. Kidneys are normal, without renal calculi, focal lesion, or hydronephrosis. Bladder is unremarkable. Stomach/Bowel: Stomach, small bowel, and colon are not abnormally distended. Diverticulosis of the sigmoid colon. Thickened wall of the sigmoid colon and rectum. Circumscribed fluid collection with thick wall and peripheral enhancement demonstrated in the space between the rectum and bladder. There is a tiny gas bubble. This is consistent with an abscess. This probably represents changes related to diverticulitis with localized perforation leading to a loculated abscess. The abscess cavity measures about 5.1 x 7.1 x 7.3 cm.  The appendix is segmentally visualized and appears normal. Vascular/Lymphatic: No significant vascular findings are present. No enlarged abdominal or pelvic lymph nodes. Reproductive: Prostate is unremarkable. Other: No free air or free fluid in the abdomen. Abdominal wall musculature appears intact. Musculoskeletal: No acute or significant osseous findings. IMPRESSION: There is a large abscess in the deep pelvis  between the rectum and bladder. Diverticulosis of the sigmoid colon with thick wall of the rectum and sigmoid colon. Likely this represents diverticulitis with localized perforation leading to abscess formation. Electronically Signed   By: Burman Nieves M.D.   On: 09/09/2016 22:44   Ct Image Guided Drainage By Percutaneous Catheter  Result Date: 09/10/2016 INDICATION: Diverticular abscess. Please perform CT-guided percutaneous drainage catheter placement for infection source control. EXAM: CT IMAGE GUIDED DRAINAGE BY PERCUTANEOUS CATHETER COMPARISON:  CT abdomen and pelvis - 07/10/2017 MEDICATIONS: The patient is currently admitted to the hospital and receiving intravenous antibiotics. The antibiotics were administered within an appropriate time frame prior to the initiation of the procedure. ANESTHESIA/SEDATION: Moderate (conscious) sedation was employed during this procedure. A total of Versed 3.5 mg and Fentanyl 175 mcg was administered intravenously. Moderate Sedation Time: 35 minutes. The patient's level of consciousness and vital signs were monitored continuously by radiology nursing throughout the procedure under my direct supervision. CONTRAST:  None COMPLICATIONS: None immediate. PROCEDURE: Informed written consent was obtained from the patient after a discussion of the risks, benefits and alternatives to treatment. The patient was placed supine on the CT gantry and a pre procedural CT was performed re-demonstrating the known abscess/fluid collection within the lower pelvis with dominant component measuring approximately 7.4 x 5.1 cm (image 32, series 8). The procedure was planned. A timeout was performed prior to the initiation of the procedure. The skin overlying the anterior aspect of the left lower abdomen/pelvis was prepped and draped in the usual sterile fashion. The overlying soft tissues were anesthetized with 1% lidocaine with epinephrine. Appropriate trajectory was planned with the use of a  22 gauge spinal needle. An 18 gauge trocar needle was advanced into the abscess/fluid collection and a short Amplatz super stiff wire was coiled within the collection. Appropriate positioning was confirmed with a limited CT scan. The tract was serially dilated however initially there was difficulty advancing the percutaneous drainage catheter into the well-formed pelvic abscess. Ultimately, initial attempt at drainage catheter placement proved unsuccessful. Again, an 18 gauge trocar needle was advanced into the abscess/fluid collection and a short Amplatz super stiff wire was coiled within the collection. Appropriate positioning was confirmed with a limited CT scan. The tract was serially dilated allowing placement of a 10 [place] all-purpose drainage catheter. Appropriate positioning was confirmed with a limited postprocedural CT scan. Approximately 300 cc of purulent, foul smelling fluid fluid was aspirated. The tube was connected to a drainage bag and sutured in place. A dressing was placed. The patient tolerated the procedure well without immediate post procedural complication. IMPRESSION: Successful CT guided placement of a 10 French all purpose drain catheter into the pelvic diverticular abscess via anterior left lower abdominal/pelvic approach with aspiration of 300 cc of purulent, foul smelling fluid. Samples were sent to the laboratory as requested by the ordering clinical team. Electronically Signed   By: Simonne Come M.D.   On: 09/10/2016 14:59    Anti-infectives: Anti-infectives    Start     Dose/Rate Route Frequency Ordered Stop   09/10/16 1200  ciprofloxacin (CIPRO) IVPB 400 mg     400 mg 200 mL/hr  over 60 Minutes Intravenous Every 12 hours 09/10/16 0023     09/10/16 0030  metroNIDAZOLE (FLAGYL) IVPB 500 mg     500 mg 100 mL/hr over 60 Minutes Intravenous Every 8 hours 09/10/16 0017     09/10/16 0030  ciprofloxacin (CIPRO) IVPB 400 mg  Status:  Discontinued     400 mg 200 mL/hr over 60  Minutes Intravenous  Once 09/10/16 0023 09/10/16 0050   09/10/16 0000  piperacillin-tazobactam (ZOSYN) IVPB 3.375 g  Status:  Discontinued     3.375 g 12.5 mL/hr over 240 Minutes Intravenous  Once 09/09/16 2355 09/10/16 0016       Assessment/Plan Sigmoid diverticulitis with abscess  - CT shows sigmoid diverticulitis with localized perforation and a large abscess in the deep pelvis between the rectum and bladder - WBC up to 11.1; TMAX 102 - blood cultures pending - s/p IR drain placement 1/13, culture pending  Alcohol use - CIWA Asthma - PRN albuterol nebs  ID - IV cipro/flagyl 1/13>> FEN - IVF, NPO VTE - SCDs  Plan - Abdominal pain slightly improved from yesterday.      S/p IR drain placement 1/13, culture pending. WBC up to 11.1 today and TMAX 102 over night.            Continue NPO and IV antibiotics. CBC in AM    LOS: 1 day    Edson Snowball , [facility] Surgery 09/11/2016, 7:44 AM Pager: [phone] Consults: [phone] Mon-Fri 7:00 am-4:30 pm Sat-Sun 7:00 am-11:30 am  Agree with above.  Needs to ambulate more.  Ovidio Kin, MD, [facility] Surgery Pager: [phone] Office phone:  [phone]

## 2016-09-11 NOTE — Progress Notes (Signed)
PROGRESS NOTE    Daniel Berger  ZOX:[id number] DOB: [birth year] DOA: 09/09/2016 PCP: No PCP Per Patient   Brief Narrative: 35/M with history of asthma/alcohol abuse admitted with abdominal pain and diarrhea for 1-2 weeks, found to have acute diverticulitis with perf and deep pelvic abscess  Assessment & Plan:  1. Acute diverticulitis with perforation and large abscess in the deep pelvis -Continue IV ciprofloxacin and Flagyl, NPO, IV fluids -CCS consulting -IR consulted, s/p percutaneous abscess drainage 1/13 CT guided placed of a 10 Fr drainage catheter placement into the pelvic abscess via L anterior lower abd/pelvic approach yielding 160 cc of purulent, foul smelling fluid -continue current care  2. Alcohol abuse -Counseled, continue thiamine, monitor on CIWA protocol, no evidence of withdrawal yet  DVT prophylaxis:add lovenox Code Status:Full Code Family Communication:None at bedside Disposition Plan:Home when improved   Consultants:   CCS  IR  Procedure:    Subjective: Feels better  Objective: Vitals:   09/10/16 1644 09/10/16 1959 09/11/16 0129 09/11/16 0502  BP: (!) 130/31 123/68  113/69  Pulse: 78 100  66  Resp: 16 19  19   Temp: 97.8 F (36.6 C) (!) 102 F (38.9 C) 98.6 F (37 C) 97.8 F (36.6 C)  TempSrc: Oral Oral Oral Oral  SpO2: 100% 100%  100%  Weight:      Height:        Intake/Output Summary (Last 24 hours) at 09/11/16 1313 Last data filed at 09/11/16 0825  Gross per 24 hour  Intake              355 ml  Output             1545 ml  Net            -1190 ml   Filed Weights   09/10/16 0152  Weight: 59.2 kg (130 lb 8.2 oz)    Examination:  General exam: Appears calm and comfortable, AAOx3 Respiratory system: Clear to auscultation. Respiratory effort normal. Cardiovascular system: S1 & S2 heard, RRR. No pedal edema. Gastrointestinal system: Abdomen is nondistended, soft and nontender. Drain noted, bowel sounds heard. Central nervous  system: Alert and oriented. No focal neurological deficits. Extremities: Symmetric 5 x 5 power. Skin: No rashes, lesions or ulcers Psychiatry: Judgement and insight appear normal. Mood & affect appropriate.     Data Reviewed: I have personally reviewed following labs and imaging studies  CBC:  Recent Labs Lab 09/09/16 1317 09/10/16 0451 09/11/16 0315  WBC 12.0* 10.7* 11.1*  HGB 12.3* 12.1* 11.9*  HCT 36.5* 36.1* 35.7*  MCV 90.1 89.6 90.6  PLT 440* 444* 411*   Basic Metabolic Panel:  Recent Labs Lab 09/09/16 1317 09/10/16 0451 09/11/16 0315  NA 137 135 135  K 4.3 3.9 3.8  CL 101 102 102  CO2 25 24 23   GLUCOSE 96 93 82  BUN 5* 6 8  CREATININE 1.02 0.84 1.09  CALCIUM 8.9 8.8* 8.6*   GFR: Estimated Creatinine Clearance: 78.5 mL/min (by C-G formula based on SCr of 1.09 mg/dL). Liver Function Tests:  Recent Labs Lab 09/09/16 1317  AST 14*  ALT 22  ALKPHOS 57  BILITOT 0.4  PROT 7.6  ALBUMIN 3.0*    Recent Labs Lab 09/09/16 1317  LIPASE 25   No results for input(s): AMMONIA in the last 168 hours. Coagulation Profile:  Recent Labs Lab 09/10/16 0210  INR 1.30   Cardiac Enzymes: No results for input(s): CKTOTAL, CKMB, CKMBINDEX, TROPONINI in the last 168 hours.  BNP (last 3 results) No results for input(s): PROBNP in the last 8760 hours. HbA1C: No results for input(s): HGBA1C in the last 72 hours. CBG:  Recent Labs Lab 09/11/16 0821  GLUCAP 77   Lipid Profile: No results for input(s): CHOL, HDL, LDLCALC, TRIG, CHOLHDL, LDLDIRECT in the last 72 hours. Thyroid Function Tests: No results for input(s): TSH, T4TOTAL, FREET4, T3FREE, THYROIDAB in the last 72 hours. Anemia Panel: No results for input(s): VITAMINB12, FOLATE, FERRITIN, TIBC, IRON, RETICCTPCT in the last 72 hours. Urine analysis:    Component Value Date/Time   COLORURINE YELLOW 09/09/2016 1301   APPEARANCEUR CLEAR 09/09/2016 1301   LABSPEC 1.018 09/09/2016 1301   PHURINE 5.0  09/09/2016 1301   GLUCOSEU NEGATIVE 09/09/2016 1301   HGBUR NEGATIVE 09/09/2016 1301   BILIRUBINUR NEGATIVE 09/09/2016 1301   KETONESUR NEGATIVE 09/09/2016 1301   PROTEINUR NEGATIVE 09/09/2016 1301   NITRITE NEGATIVE 09/09/2016 1301   LEUKOCYTESUR NEGATIVE 09/09/2016 1301   Sepsis Labs: @LABRCNTIP (procalcitonin:4,lacticidven:4)  ) Recent Results (from the past 240 hour(s))  Culture, blood (x 2)     Status: None (Preliminary result)   Collection Time: 09/10/16  2:18 AM  Result Value Ref Range Status   Specimen Description BLOOD LEFT ARM  Final   Special Requests BOTTLES DRAWN AEROBIC AND ANAEROBIC 5ML  Final   Culture NO GROWTH 1 DAY  Final   Report Status PENDING  Incomplete  Culture, blood (x 2)     Status: None (Preliminary result)   Collection Time: 09/10/16  2:32 AM  Result Value Ref Range Status   Specimen Description BLOOD LEFT HAND  Final   Special Requests IN PEDIATRIC BOTTLE 3ML  Final   Culture NO GROWTH 1 DAY  Final   Report Status PENDING  Incomplete  C difficile quick scan w PCR reflex     Status: None   Collection Time: 09/10/16  7:34 AM  Result Value Ref Range Status   C Diff antigen NEGATIVE NEGATIVE Final   C Diff toxin NEGATIVE NEGATIVE Final   C Diff interpretation No C. difficile detected.  Final  Aerobic/Anaerobic Culture (surgical/deep wound)     Status: None (Preliminary result)   Collection Time: 09/10/16  2:44 PM  Result Value Ref Range Status   Specimen Description ABSCESS DRAINAGE  Final   Special Requests DIVERTICULAR ABSCESS  Final   Gram Stain   Final    ABUNDANT WBC PRESENT, PREDOMINANTLY PMN ABUNDANT GRAM POSITIVE COCCI IN CHAINS ABUNDANT GRAM POSITIVE COCCI IN PAIRS ABUNDANT GRAM POSITIVE COCCI IN CLUSTERS ABUNDANT GRAM NEGATIVE RODS    Culture CULTURE REINCUBATED FOR BETTER GROWTH  Final   Report Status PENDING  Incomplete         Radiology Studies: Ct Abdomen Pelvis W Contrast  Result Date: 09/09/2016 CLINICAL DATA:   Abdominal pain for 1 week. EXAM: CT ABDOMEN AND PELVIS WITH CONTRAST TECHNIQUE: Multidetector CT imaging of the abdomen and pelvis was performed using the standard protocol following bolus administration of intravenous contrast. CONTRAST:  100mL ISOVUE-300 IOPAMIDOL (ISOVUE-300) INJECTION 61% COMPARISON:  None. FINDINGS: Lower chest: Lung bases are clear. Hepatobiliary: No focal liver abnormality is seen. No gallstones, gallbladder wall thickening, or biliary dilatation. Pancreas: Unremarkable. No pancreatic ductal dilatation or surrounding inflammatory changes. Spleen: Normal in size without focal abnormality. Adrenals/Urinary Tract: Adrenal glands are unremarkable. Kidneys are normal, without renal calculi, focal lesion, or hydronephrosis. Bladder is unremarkable. Stomach/Bowel: Stomach, small bowel, and colon are not abnormally distended. Diverticulosis of the sigmoid colon. Thickened wall of the  sigmoid colon and rectum. Circumscribed fluid collection with thick wall and peripheral enhancement demonstrated in the space between the rectum and bladder. There is a tiny gas bubble. This is consistent with an abscess. This probably represents changes related to diverticulitis with localized perforation leading to a loculated abscess. The abscess cavity measures about 5.1 x 7.1 x 7.3 cm. The appendix is segmentally visualized and appears normal. Vascular/Lymphatic: No significant vascular findings are present. No enlarged abdominal or pelvic lymph nodes. Reproductive: Prostate is unremarkable. Other: No free air or free fluid in the abdomen. Abdominal wall musculature appears intact. Musculoskeletal: No acute or significant osseous findings. IMPRESSION: There is a large abscess in the deep pelvis between the rectum and bladder. Diverticulosis of the sigmoid colon with thick wall of the rectum and sigmoid colon. Likely this represents diverticulitis with localized perforation leading to abscess formation.  Electronically Signed   By: Burman Nieves M.D.   On: 09/09/2016 22:44   Ct Image Guided Drainage By Percutaneous Catheter  Result Date: 09/10/2016 INDICATION: Diverticular abscess. Please perform CT-guided percutaneous drainage catheter placement for infection source control. EXAM: CT IMAGE GUIDED DRAINAGE BY PERCUTANEOUS CATHETER COMPARISON:  CT abdomen and pelvis - 07/10/2017 MEDICATIONS: The patient is currently admitted to the hospital and receiving intravenous antibiotics. The antibiotics were administered within an appropriate time frame prior to the initiation of the procedure. ANESTHESIA/SEDATION: Moderate (conscious) sedation was employed during this procedure. A total of Versed 3.5 mg and Fentanyl 175 mcg was administered intravenously. Moderate Sedation Time: 35 minutes. The patient's level of consciousness and vital signs were monitored continuously by radiology nursing throughout the procedure under my direct supervision. CONTRAST:  None COMPLICATIONS: None immediate. PROCEDURE: Informed written consent was obtained from the patient after a discussion of the risks, benefits and alternatives to treatment. The patient was placed supine on the CT gantry and a pre procedural CT was performed re-demonstrating the known abscess/fluid collection within the lower pelvis with dominant component measuring approximately 7.4 x 5.1 cm (image 32, series 8). The procedure was planned. A timeout was performed prior to the initiation of the procedure. The skin overlying the anterior aspect of the left lower abdomen/pelvis was prepped and draped in the usual sterile fashion. The overlying soft tissues were anesthetized with 1% lidocaine with epinephrine. Appropriate trajectory was planned with the use of a 22 gauge spinal needle. An 18 gauge trocar needle was advanced into the abscess/fluid collection and a short Amplatz super stiff wire was coiled within the collection. Appropriate positioning was confirmed with  a limited CT scan. The tract was serially dilated however initially there was difficulty advancing the percutaneous drainage catheter into the well-formed pelvic abscess. Ultimately, initial attempt at drainage catheter placement proved unsuccessful. Again, an 18 gauge trocar needle was advanced into the abscess/fluid collection and a short Amplatz super stiff wire was coiled within the collection. Appropriate positioning was confirmed with a limited CT scan. The tract was serially dilated allowing placement of a 10 [place] all-purpose drainage catheter. Appropriate positioning was confirmed with a limited postprocedural CT scan. Approximately 300 cc of purulent, foul smelling fluid fluid was aspirated. The tube was connected to a drainage bag and sutured in place. A dressing was placed. The patient tolerated the procedure well without immediate post procedural complication. IMPRESSION: Successful CT guided placement of a 10 French all purpose drain catheter into the pelvic diverticular abscess via anterior left lower abdominal/pelvic approach with aspiration of 300 cc of purulent, foul smelling fluid. Samples were sent  to the laboratory as requested by the ordering clinical team. Electronically Signed   By: Simonne Come M.D.   On: 09/10/2016 14:59        Scheduled Meds: . ciprofloxacin  400 mg Intravenous Q12H  . folic acid  1 mg Oral Daily  . LORazepam  0-4 mg Intravenous Q6H   Followed by  . [START ON 09/12/2016] LORazepam  0-4 mg Intravenous Q12H  . metronidazole  500 mg Intravenous Q8H  . multivitamin with minerals  1 tablet Oral Daily  . thiamine  100 mg Oral Daily   Or  . thiamine  100 mg Intravenous Daily   Continuous Infusions: . sodium chloride 0.45 % 1,000 mL infusion 100 mL/hr at 09/11/16 0830     LOS: 1 day    Time spent:    Zannie Cove, MD [facility] Pager [phone]  If 7PM-7AM, please contact night-coverage [email or web address] Password [email or web address] 09/11/2016,  1:13 PM

## 2016-09-12 LAB — BASIC METABOLIC PANEL
ANION GAP: 14 (ref 5–15)
BUN: 7 mg/dL (ref 6–20)
CHLORIDE: 100 mmol/L — AB (ref 101–111)
CO2: 22 mmol/L (ref 22–32)
Calcium: 8.9 mg/dL (ref 8.9–10.3)
Creatinine, Ser: 0.92 mg/dL (ref 0.61–1.24)
GFR calc non Af Amer: >60 mL/min (ref >60)
Glucose, Bld: 75 mg/dL (ref 65–99)
Potassium: 3.9 mmol/L (ref 3.5–5.1)
Sodium: 136 mmol/L (ref 135–145)

## 2016-09-12 LAB — CBC
HCT: 36 % — ABNORMAL LOW (ref 39.0–52.0)
Hemoglobin: 12 g/dL — ABNORMAL LOW (ref 13.0–17.0)
MCH: 30 pg (ref 26.0–34.0)
MCHC: 33.3 g/dL (ref 30.0–36.0)
MCV: 90 fL (ref 78.0–100.0)
Platelets: 410 10*3/uL — ABNORMAL HIGH (ref 150–400)
RBC: 4 MIL/uL — AB (ref 4.22–5.81)
RDW: 12.6 % (ref 11.5–15.5)
WBC: 8.8 10*3/uL (ref 4.0–10.5)

## 2016-09-12 LAB — GLUCOSE, CAPILLARY: Glucose-Capillary: 88 mg/dL (ref 65–99)

## 2016-09-12 NOTE — Care Management Note (Addendum)
Case Management Note  Patient Details  Name: Daniel Berger MRN: [id number] Date of Birth: 09/07/1980  Subjective/Objective:   PT admitted with abdominal Pain and diarrhea                 Action/Plan:  PTA independent from home with wife.  Pt confirmed he does not have active insurance and [facility]  CM will continue to follow for discharge and will arrange follow up appt   Expected Discharge Date:                  Expected Discharge Plan:  Home/Self Care  In-House Referral:     Discharge planning Services  CM Consult  Post Acute Care Choice:    Choice offered to:  Patient  DME Arranged:    DME Agency:     HH Arranged:  RN HH Agency:  [facility]  Status of Service:  In process, will continue to follow  If discussed at Long Length of Stay Meetings, dates discussed:    Additional Comments :09/14/2016 CM spoke with attending -  Pt will not need IV antibiotics and will only need HHRN for drain maintenance.  [facility].   [facility] and [facility] referral accepted.  [facility] however [facility] is closed due to inclement weather.  CM provided pt [facility] brochure and instructed pt to contact ASAP post discharge to request post discharge follow up appt - CM also provided pt [facility] contact info in case [facility] follow up [facility]t.  CM explained that pt can utilize clinic pharmacy once appt is made,  CM following for discharge to determine if pt will need MATCH - pt was not on prescription meds prior to admit.   Per pt; his discharge address will be as listed below - [facility] [address] [place] [phone]  Cherylann Parrlaxton, Idalee Foxworthy S, RN 09/14/2016, 1:00 PM

## 2016-09-12 NOTE — Progress Notes (Signed)
PROGRESS NOTE    Daniel Berger  ZOX:[id number] DOB: 12-03-1980 DOA: 09/09/2016 PCP: No PCP Per Patient   Brief Narrative: 35/M with history of asthma/alcohol abuse admitted with abdominal pain and diarrhea for 1-2 weeks, found to have acute diverticulitis with perf and deep pelvic abscess  Assessment & Plan:  1. Acute diverticulitis with perforation and large abscess in the deep pelvis -Continue IV ciprofloxacin and Flagyl,  IV fluids -CCS consulting, starting clears today -IR consulted, s/p percutaneous abscess drainage 1/13 CT guided placed of a 10 Fr drainage catheter placement into the pelvic abscess via L anterior lower abd/pelvic approach yielding 160 cc of purulent, foul smelling fluid -continue current care  2. Alcohol abuse -Counseled, continue thiamine, monitor on CIWA protocol, no evidence of withdrawal yet  DVT prophylaxis: lovenox Code Status:Full Code Family Communication:None at bedside Disposition Plan:Home when improved   Consultants:   CCS  IR  Procedure:    Subjective: Feels ok, ready to eat/drink  Objective: Vitals:   09/11/16 0502 09/11/16 1423 09/11/16 2121 09/12/16 0610  BP: 113/69 125/74 129/81 131/86  Pulse: 66 70 66 63  Resp: 19  17 18   Temp: 97.8 F (36.6 C) 100.2 F (37.9 C) 98.9 F (37.2 C) 98.4 F (36.9 C)  TempSrc: Oral Oral Oral Oral  SpO2: 100% 90% 99% 97%  Weight:      Height:        Intake/Output Summary (Last 24 hours) at 09/12/16 1117 Last data filed at 09/12/16 0700  Gross per 24 hour  Intake           1612.5 ml  Output             1875 ml  Net           -262.5 ml   Filed Weights   09/10/16 0152  Weight: 59.2 kg (130 lb 8.2 oz)    Examination:  General exam: Appears calm and comfortable, AAOx3 Respiratory system: Clear to auscultation. Respiratory effort normal. Cardiovascular system: S1 & S2 heard, RRR. No pedal edema. Gastrointestinal system: Abdomen is soft and nontender, increased BS. Drain  noted Central nervous system: Alert and oriented. No focal neurological deficits. Extremities: Symmetric 5 x 5 power. Skin: No rashes, lesions or ulcers Psychiatry: Judgement and insight appear normal. Mood & affect appropriate.     Data Reviewed: I have personally reviewed following labs and imaging studies  CBC:  Recent Labs Lab 09/09/16 1317 09/10/16 0451 09/11/16 0315 09/12/16 0545  WBC 12.0* 10.7* 11.1* 8.8  HGB 12.3* 12.1* 11.9* 12.0*  HCT 36.5* 36.1* 35.7* 36.0*  MCV 90.1 89.6 90.6 90.0  PLT 440* 444* 411* 410*   Basic Metabolic Panel:  Recent Labs Lab 09/09/16 1317 09/10/16 0451 09/11/16 0315 09/12/16 0545  NA 137 135 135 136  K 4.3 3.9 3.8 3.9  CL 101 102 102 100*  CO2 25 24 23 22   GLUCOSE 96 93 82 75  BUN 5* 6 8 7   CREATININE 1.02 0.84 1.09 0.92  CALCIUM 8.9 8.8* 8.6* 8.9   GFR: Estimated Creatinine Clearance: 92.9 mL/min (by C-G formula based on SCr of 0.92 mg/dL). Liver Function Tests:  Recent Labs Lab 09/09/16 1317  AST 14*  ALT 22  ALKPHOS 57  BILITOT 0.4  PROT 7.6  ALBUMIN 3.0*    Recent Labs Lab 09/09/16 1317  LIPASE 25   No results for input(s): AMMONIA in the last 168 hours. Coagulation Profile:  Recent Labs Lab 09/10/16 0210  INR 1.30  Cardiac Enzymes: No results for input(s): CKTOTAL, CKMB, CKMBINDEX, TROPONINI in the last 168 hours. BNP (last 3 results) No results for input(s): PROBNP in the last 8760 hours. HbA1C: No results for input(s): HGBA1C in the last 72 hours. CBG:  Recent Labs Lab 09/11/16 0821 09/12/16 0801  GLUCAP 77 88   Lipid Profile: No results for input(s): CHOL, HDL, LDLCALC, TRIG, CHOLHDL, LDLDIRECT in the last 72 hours. Thyroid Function Tests: No results for input(s): TSH, T4TOTAL, FREET4, T3FREE, THYROIDAB in the last 72 hours. Anemia Panel: No results for input(s): VITAMINB12, FOLATE, FERRITIN, TIBC, IRON, RETICCTPCT in the last 72 hours. Urine analysis:    Component Value Date/Time    COLORURINE YELLOW 09/09/2016 1301   APPEARANCEUR CLEAR 09/09/2016 1301   LABSPEC 1.018 09/09/2016 1301   PHURINE 5.0 09/09/2016 1301   GLUCOSEU NEGATIVE 09/09/2016 1301   HGBUR NEGATIVE 09/09/2016 1301   BILIRUBINUR NEGATIVE 09/09/2016 1301   KETONESUR NEGATIVE 09/09/2016 1301   PROTEINUR NEGATIVE 09/09/2016 1301   NITRITE NEGATIVE 09/09/2016 1301   LEUKOCYTESUR NEGATIVE 09/09/2016 1301   Sepsis Labs: @LABRCNTIP (procalcitonin:4,lacticidven:4)  ) Recent Results (from the past 240 hour(s))  Culture, blood (x 2)     Status: None (Preliminary result)   Collection Time: 09/10/16  2:18 AM  Result Value Ref Range Status   Specimen Description BLOOD LEFT ARM  Final   Special Requests BOTTLES DRAWN AEROBIC AND ANAEROBIC 5ML  Final   Culture NO GROWTH 1 DAY  Final   Report Status PENDING  Incomplete  Culture, blood (x 2)     Status: None (Preliminary result)   Collection Time: 09/10/16  2:32 AM  Result Value Ref Range Status   Specimen Description BLOOD LEFT HAND  Final   Special Requests IN PEDIATRIC BOTTLE 3ML  Final   Culture NO GROWTH 1 DAY  Final   Report Status PENDING  Incomplete  C difficile quick scan w PCR reflex     Status: None   Collection Time: 09/10/16  7:34 AM  Result Value Ref Range Status   C Diff antigen NEGATIVE NEGATIVE Final   C Diff toxin NEGATIVE NEGATIVE Final   C Diff interpretation No C. difficile detected.  Final  Aerobic/Anaerobic Culture (surgical/deep wound)     Status: None (Preliminary result)   Collection Time: 09/10/16  2:44 PM  Result Value Ref Range Status   Specimen Description ABSCESS DRAINAGE  Final   Special Requests DIVERTICULAR ABSCESS  Final   Gram Stain   Final    ABUNDANT WBC PRESENT, PREDOMINANTLY PMN ABUNDANT GRAM POSITIVE COCCI IN CHAINS ABUNDANT GRAM POSITIVE COCCI IN PAIRS ABUNDANT GRAM POSITIVE COCCI IN CLUSTERS ABUNDANT GRAM NEGATIVE RODS    Culture CULTURE REINCUBATED FOR BETTER GROWTH  Final   Report Status PENDING   Incomplete         Radiology Studies: Ct Image Guided Drainage By Percutaneous Catheter  Result Date: 09/10/2016 INDICATION: Diverticular abscess. Please perform CT-guided percutaneous drainage catheter placement for infection source control. EXAM: CT IMAGE GUIDED DRAINAGE BY PERCUTANEOUS CATHETER COMPARISON:  CT abdomen and pelvis - 07/10/2017 MEDICATIONS: The patient is currently admitted to the hospital and receiving intravenous antibiotics. The antibiotics were administered within an appropriate time frame prior to the initiation of the procedure. ANESTHESIA/SEDATION: Moderate (conscious) sedation was employed during this procedure. A total of Versed 3.5 mg and Fentanyl 175 mcg was administered intravenously. Moderate Sedation Time: 35 minutes. The patient's level of consciousness and vital signs were monitored continuously by radiology nursing throughout the procedure under  my direct supervision. CONTRAST:  None COMPLICATIONS: None immediate. PROCEDURE: Informed written consent was obtained from the patient after a discussion of the risks, benefits and alternatives to treatment. The patient was placed supine on the CT gantry and a pre procedural CT was performed re-demonstrating the known abscess/fluid collection within the lower pelvis with dominant component measuring approximately 7.4 x 5.1 cm (image 32, series 8). The procedure was planned. A timeout was performed prior to the initiation of the procedure. The skin overlying the anterior aspect of the left lower abdomen/pelvis was prepped and draped in the usual sterile fashion. The overlying soft tissues were anesthetized with 1% lidocaine with epinephrine. Appropriate trajectory was planned with the use of a 22 gauge spinal needle. An 18 gauge trocar needle was advanced into the abscess/fluid collection and a short Amplatz super stiff wire was coiled within the collection. Appropriate positioning was confirmed with a limited CT scan. The tract  was serially dilated however initially there was difficulty advancing the percutaneous drainage catheter into the well-formed pelvic abscess. Ultimately, initial attempt at drainage catheter placement proved unsuccessful. Again, an 18 gauge trocar needle was advanced into the abscess/fluid collection and a short Amplatz super stiff wire was coiled within the collection. Appropriate positioning was confirmed with a limited CT scan. The tract was serially dilated allowing placement of a 10 [place] all-purpose drainage catheter. Appropriate positioning was confirmed with a limited postprocedural CT scan. Approximately 300 cc of purulent, foul smelling fluid fluid was aspirated. The tube was connected to a drainage bag and sutured in place. A dressing was placed. The patient tolerated the procedure well without immediate post procedural complication. IMPRESSION: Successful CT guided placement of a 10 French all purpose drain catheter into the pelvic diverticular abscess via anterior left lower abdominal/pelvic approach with aspiration of 300 cc of purulent, foul smelling fluid. Samples were sent to the laboratory as requested by the ordering clinical team. Electronically Signed   By: Simonne Come M.D.   On: 09/10/2016 14:59        Scheduled Meds: . ciprofloxacin  400 mg Intravenous Q12H  . enoxaparin (LOVENOX) injection  40 mg Subcutaneous Q24H  . folic acid  1 mg Oral Daily  . metronidazole  500 mg Intravenous Q8H  . multivitamin with minerals  1 tablet Oral Daily  . thiamine  100 mg Oral Daily   Or  . thiamine  100 mg Intravenous Daily   Continuous Infusions: . sodium chloride 75 mL/hr at 09/11/16 2149     LOS: 2 days    Time spent:    Zannie Cove, MD [facility] Pager [phone]  If 7PM-7AM, please contact night-coverage [email or web address] Password [email or web address] 09/12/2016, 11:17 AM

## 2016-09-12 NOTE — Progress Notes (Signed)
Patient ID: Daniel Berger, male   DOB: 11/30/1980, 36 y.o.   MRN: [id number]    Referring Physician(s): Dr. Zannie CovePreetha Joseph  Supervising Physician: Irish LackYamagata, Glenn  [facility]  Chief Complaint: Diverticulitis with abscess  Subjective: Patient feels better.  T max only 100.2 overnight.  Tolerating clear liquids.  States output in drain has decreased.  Allergies: Patient has no known allergies.  Medications: Prior to Admission medications   Medication Sig Start Date End Date Taking? Authorizing Provider  albuterol (PROVENTIL HFA;VENTOLIN HFA) 108 (90 Base) MCG/ACT inhaler Inhale 2 puffs into the lungs every 6 (six) hours as needed for wheezing or shortness of breath.   Yes Historical Provider, MD  Magnesium Hydroxide (MILK OF MAGNESIA PO) Take 60 mLs by mouth daily as needed (diarrhea).   Yes Historical Provider, MD    Vital Signs: BP 131/86 (BP Location: Left Arm)   Pulse 63   Temp 98.4 F (36.9 C) (Oral)   Resp 18   Ht 5\' 7"  (1.702 m)   Wt 130 lb 8.2 oz (59.2 kg)   SpO2 97%   BMI 20.44 kg/m   Physical Exam: Abd: soft, seems NT around drain.  Drain currently with no output.  25cc of output documented yesterday.  Drain site is c/d/i  Imaging: Ct Abdomen Pelvis W Contrast  Result Date: 09/09/2016 CLINICAL DATA:  Abdominal pain for 1 week. EXAM: CT ABDOMEN AND PELVIS WITH CONTRAST TECHNIQUE: Multidetector CT imaging of the abdomen and pelvis was performed using the standard protocol following bolus administration of intravenous contrast. CONTRAST:  100mL ISOVUE-300 IOPAMIDOL (ISOVUE-300) INJECTION 61% COMPARISON:  None. FINDINGS: Lower chest: Lung bases are clear. Hepatobiliary: No focal liver abnormality is seen. No gallstones, gallbladder wall thickening, or biliary dilatation. Pancreas: Unremarkable. No pancreatic ductal dilatation or surrounding inflammatory changes. Spleen: Normal in size without focal abnormality. Adrenals/Urinary Tract: Adrenal glands  are unremarkable. Kidneys are normal, without renal calculi, focal lesion, or hydronephrosis. Bladder is unremarkable. Stomach/Bowel: Stomach, small bowel, and colon are not abnormally distended. Diverticulosis of the sigmoid colon. Thickened wall of the sigmoid colon and rectum. Circumscribed fluid collection with thick wall and peripheral enhancement demonstrated in the space between the rectum and bladder. There is a tiny gas bubble. This is consistent with an abscess. This probably represents changes related to diverticulitis with localized perforation leading to a loculated abscess. The abscess cavity measures about 5.1 x 7.1 x 7.3 cm. The appendix is segmentally visualized and appears normal. Vascular/Lymphatic: No significant vascular findings are present. No enlarged abdominal or pelvic lymph nodes. Reproductive: Prostate is unremarkable. Other: No free air or free fluid in the abdomen. Abdominal wall musculature appears intact. Musculoskeletal: No acute or significant osseous findings. IMPRESSION: There is a large abscess in the deep pelvis between the rectum and bladder. Diverticulosis of the sigmoid colon with thick wall of the rectum and sigmoid colon. Likely this represents diverticulitis with localized perforation leading to abscess formation. Electronically Signed   By: Burman NievesWilliam  Stevens M.D.   On: 09/09/2016 22:44   Ct Image Guided Drainage By Percutaneous Catheter  Result Date: 09/10/2016 INDICATION: Diverticular abscess. Please perform CT-guided percutaneous drainage catheter placement for infection source control. EXAM: CT IMAGE GUIDED DRAINAGE BY PERCUTANEOUS CATHETER COMPARISON:  CT abdomen and pelvis - 07/10/2017 MEDICATIONS: The patient is currently admitted to the hospital and receiving intravenous antibiotics. The antibiotics were administered within an appropriate time frame prior to the initiation of the procedure. ANESTHESIA/SEDATION: Moderate (conscious) sedation was employed during  this procedure. A total of Versed 3.5 mg and Fentanyl 175 mcg was administered intravenously. Moderate Sedation Time: 35 minutes. The patient's level of consciousness and vital signs were monitored continuously by radiology nursing throughout the procedure under my direct supervision. CONTRAST:  None COMPLICATIONS: None immediate. PROCEDURE: Informed written consent was obtained from the patient after a discussion of the risks, benefits and alternatives to treatment. The patient was placed supine on the CT gantry and a pre procedural CT was performed re-demonstrating the known abscess/fluid collection within the lower pelvis with dominant component measuring approximately 7.4 x 5.1 cm (image 32, series 8). The procedure was planned. A timeout was performed prior to the initiation of the procedure. The skin overlying the anterior aspect of the left lower abdomen/pelvis was prepped and draped in the usual sterile fashion. The overlying soft tissues were anesthetized with 1% lidocaine with epinephrine. Appropriate trajectory was planned with the use of a 22 gauge spinal needle. An 18 gauge trocar needle was advanced into the abscess/fluid collection and a short Amplatz super stiff wire was coiled within the collection. Appropriate positioning was confirmed with a limited CT scan. The tract was serially dilated however initially there was difficulty advancing the percutaneous drainage catheter into the well-formed pelvic abscess. Ultimately, initial attempt at drainage catheter placement proved unsuccessful. Again, an 18 gauge trocar needle was advanced into the abscess/fluid collection and a short Amplatz super stiff wire was coiled within the collection. Appropriate positioning was confirmed with a limited CT scan. The tract was serially dilated allowing placement of a 10 [place] all-purpose drainage catheter. Appropriate positioning was confirmed with a limited postprocedural CT scan. Approximately 300 cc of purulent,  foul smelling fluid fluid was aspirated. The tube was connected to a drainage bag and sutured in place. A dressing was placed. The patient tolerated the procedure well without immediate post procedural complication. IMPRESSION: Successful CT guided placement of a 10 French all purpose drain catheter into the pelvic diverticular abscess via anterior left lower abdominal/pelvic approach with aspiration of 300 cc of purulent, foul smelling fluid. Samples were sent to the laboratory as requested by the ordering clinical team. Electronically Signed   By: Simonne Come M.D.   On: 09/10/2016 14:59    Labs:  CBC:  Recent Labs  09/09/16 1317 09/10/16 0451 09/11/16 0315 09/12/16 0545  WBC 12.0* 10.7* 11.1* 8.8  HGB 12.3* 12.1* 11.9* 12.0*  HCT 36.5* 36.1* 35.7* 36.0*  PLT 440* 444* 411* 410*    COAGS:  Recent Labs  09/10/16 0210  INR 1.30  APTT 37*    BMP:  Recent Labs  09/09/16 1317 09/10/16 0451 09/11/16 0315 09/12/16 0545  NA 137 135 135 136  K 4.3 3.9 3.8 3.9  CL 101 102 102 100*  CO2 25 24 23 22   GLUCOSE 96 93 82 75  BUN 5* 6 8 7   CALCIUM 8.9 8.8* 8.6* 8.9  CREATININE 1.02 0.84 1.09 0.92  GFRNONAA >60 >60 >60 >60  GFRAA >60 >60 >60 >60    LIVER FUNCTION TESTS:  Recent Labs  09/09/16 1317  BILITOT 0.4  AST 14*  ALT 22  ALKPHOS 57  PROT 7.6  ALBUMIN 3.0*    Assessment and Plan: 1. Diverticulitis with abscess, s/p perc drain on 1/13 -patient doing well.  Improving with no pain in LLQ -drain will need to stay in for at least a week generally before repeat CT scan.  If still present, would plan for repeat CT towards the end of  the week. If he is discharged prior to that, then we would bring him back to the drain clinic next week for a repeat CT and drain evaluation. -will follow -cont routine drain care and irrigations  Electronically Signed: Deleon Passe E 09/12/2016, 10:20 AM   I spent a total of 15 Minutes at the the patient's bedside AND on the  patient's hospital floor or unit, greater than 50% of which was counseling/coordinating care for diverticulitis with abscess

## 2016-09-12 NOTE — Progress Notes (Signed)
[facility] Progress Note     Subjective: Pt states his abdominal pain is better. No BM since 1/13. No fever, chills, nausea or vomiting overnight. No new complaints.   Objective: Vital signs in last 24 hours: Temp:  [98.4 F (36.9 C)-100.2 F (37.9 C)] 98.4 F (36.9 C) (01/15 0610) Pulse Rate:  [63-70] 63 (01/15 0610) Resp:  [17-18] 18 (01/15 0610) BP: (125-131)/(74-86) 131/86 (01/15 0610) SpO2:  [90 %-99 %] 97 % (01/15 0610) Last BM Date: 09/11/16  Intake/Output from previous day: 01/14 0701 - 01/15 0700 In: 1617.5 [I.V.:1312.5; IV Piggyback:300] Out: 1875 [Urine:1850; Drains:25] Intake/Output this shift: No intake/output data recorded.  PE: Gen:  Alert, NAD, pleasant, cooperative, lying in bed Card:  RRR, no M/G/R heard Pulm:  effort and rate normal Abd: Soft, non distended, +BS, mild TTP to suprapubic region and LLQ. Drain in place, bag with scant sanguinous drainage Skin: no rashes noted, warm and dry  Lab Results:   Recent Labs  09/11/16 0315 09/12/16 0545  WBC 11.1* 8.8  HGB 11.9* 12.0*  HCT 35.7* 36.0*  PLT 411* 410*   BMET  Recent Labs  09/11/16 0315 09/12/16 0545  NA 135 136  K 3.8 3.9  CL 102 100*  CO2 23 22  GLUCOSE 82 75  BUN 8 7  CREATININE 1.09 0.92  CALCIUM 8.6* 8.9   PT/INR  Recent Labs  09/10/16 0210  LABPROT 16.3*  INR 1.30   CMP     Component Value Date/Time   NA 136 09/12/2016 0545   K 3.9 09/12/2016 0545   CL 100 (L) 09/12/2016 0545   CO2 22 09/12/2016 0545   GLUCOSE 75 09/12/2016 0545   BUN 7 09/12/2016 0545   CREATININE 0.92 09/12/2016 0545   CALCIUM 8.9 09/12/2016 0545   PROT 7.6 09/09/2016 1317   ALBUMIN 3.0 (L) 09/09/2016 1317   AST 14 (L) 09/09/2016 1317   ALT 22 09/09/2016 1317   ALKPHOS 57 09/09/2016 1317   BILITOT 0.4 09/09/2016 1317   GFRNONAA >60 09/12/2016 0545   GFRAA >60 09/12/2016 0545   Lipase     Component Value Date/Time   LIPASE 25 09/09/2016 1317        Studies/Results: Ct Image Guided Drainage By Percutaneous Catheter  Result Date: 09/10/2016 INDICATION: Diverticular abscess. Please perform CT-guided percutaneous drainage catheter placement for infection source control. EXAM: CT IMAGE GUIDED DRAINAGE BY PERCUTANEOUS CATHETER COMPARISON:  CT abdomen and pelvis - 07/10/2017 MEDICATIONS: The patient is currently admitted to the hospital and receiving intravenous antibiotics. The antibiotics were administered within an appropriate time frame prior to the initiation of the procedure. ANESTHESIA/SEDATION: Moderate (conscious) sedation was employed during this procedure. A total of Versed 3.5 mg and Fentanyl 175 mcg was administered intravenously. Moderate Sedation Time: 35 minutes. The patient's level of consciousness and vital signs were monitored continuously by radiology nursing throughout the procedure under my direct supervision. CONTRAST:  None COMPLICATIONS: None immediate. PROCEDURE: Informed written consent was obtained from the patient after a discussion of the risks, benefits and alternatives to treatment. The patient was placed supine on the CT gantry and a pre procedural CT was performed re-demonstrating the known abscess/fluid collection within the lower pelvis with dominant component measuring approximately 7.4 x 5.1 cm (image 32, series 8). The procedure was planned. A timeout was performed prior to the initiation of the procedure. The skin overlying the anterior aspect of the left lower abdomen/pelvis was prepped and draped in the usual sterile fashion. The overlying  soft tissues were anesthetized with 1% lidocaine with epinephrine. Appropriate trajectory was planned with the use of a 22 gauge spinal needle. An 18 gauge trocar needle was advanced into the abscess/fluid collection and a short Amplatz super stiff wire was coiled within the collection. Appropriate positioning was confirmed with a limited CT scan. The tract was serially  dilated however initially there was difficulty advancing the percutaneous drainage catheter into the well-formed pelvic abscess. Ultimately, initial attempt at drainage catheter placement proved unsuccessful. Again, an 18 gauge trocar needle was advanced into the abscess/fluid collection and a short Amplatz super stiff wire was coiled within the collection. Appropriate positioning was confirmed with a limited CT scan. The tract was serially dilated allowing placement of a 10 [place] all-purpose drainage catheter. Appropriate positioning was confirmed with a limited postprocedural CT scan. Approximately 300 cc of purulent, foul smelling fluid fluid was aspirated. The tube was connected to a drainage bag and sutured in place. A dressing was placed. The patient tolerated the procedure well without immediate post procedural complication. IMPRESSION: Successful CT guided placement of a 10 French all purpose drain catheter into the pelvic diverticular abscess via anterior left lower abdominal/pelvic approach with aspiration of 300 cc of purulent, foul smelling fluid. Samples were sent to the laboratory as requested by the ordering clinical team. Electronically Signed   By: Simonne Come M.D.   On: 09/10/2016 14:59    Anti-infectives: Anti-infectives    Start     Dose/Rate Route Frequency Ordered Stop   09/10/16 1200  ciprofloxacin (CIPRO) IVPB 400 mg     400 mg 200 mL/hr over 60 Minutes Intravenous Every 12 hours 09/10/16 0023     09/10/16 0030  metroNIDAZOLE (FLAGYL) IVPB 500 mg     500 mg 100 mL/hr over 60 Minutes Intravenous Every 8 hours 09/10/16 0017     09/10/16 0030  ciprofloxacin (CIPRO) IVPB 400 mg  Status:  Discontinued     400 mg 200 mL/hr over 60 Minutes Intravenous  Once 09/10/16 0023 09/10/16 0050   09/10/16 0000  piperacillin-tazobactam (ZOSYN) IVPB 3.375 g  Status:  Discontinued     3.375 g 12.5 mL/hr over 240 Minutes Intravenous  Once 09/09/16 2355 09/10/16 0016        Assessment/Plan  Sigmoid diverticulitis with abscess             - CT shows sigmoid diverticulitis with localized perforation and a large abscess in the deep pelvis between the rectum and bladder - WBC WNL; TMAX 102, afebrile overnight - blood cultures no growth to date - s/p IR drain placement 1/13, culture pending  Alcohol use - CIWA Asthma - PRN albuterol nebs  ID - IV cipro/flagyl 1/13>> FEN - IVF, clears VTE - SCDs & lovenox  Plan - Abdominal pain slightly improved from yesterday.                          S/p IR drain placement 1/13, culture pending. WBC WNL and afebrile overnight                                  Continue IV antibiotics, with improvement in pain and normal WBC, will advance to clears and    see how he tolerates them. If he has increase in pain then back to NPO. CBC in AM     LOS: 2 days    Troyce Febo L  Rhona Raider [facility] 09/12/2016, 8:26 AM Pager: [phone] Consults: [phone] Mon-Fri 7:00 am-4:30 pm Sat-Sun 7:00 am-11:30 am

## 2016-09-13 LAB — CBC
HEMATOCRIT: 33.9 % — AB (ref 39.0–52.0)
Hemoglobin: 11.5 g/dL — ABNORMAL LOW (ref 13.0–17.0)
MCH: 30.5 pg (ref 26.0–34.0)
MCHC: 33.9 g/dL (ref 30.0–36.0)
MCV: 89.9 fL (ref 78.0–100.0)
PLATELETS: 407 10*3/uL — AB (ref 150–400)
RBC: 3.77 MIL/uL — ABNORMAL LOW (ref 4.22–5.81)
RDW: 12.9 % (ref 11.5–15.5)
WBC: 7.2 10*3/uL (ref 4.0–10.5)

## 2016-09-13 LAB — BASIC METABOLIC PANEL
ANION GAP: 8 (ref 5–15)
BUN: <5 mg/dL — ABNORMAL LOW (ref 6–20)
CO2: 23 mmol/L (ref 22–32)
CREATININE: 0.81 mg/dL (ref 0.61–1.24)
Calcium: 8.6 mg/dL — ABNORMAL LOW (ref 8.9–10.3)
Chloride: 105 mmol/L (ref 101–111)
Glucose, Bld: 88 mg/dL (ref 65–99)
Potassium: 4.1 mmol/L (ref 3.5–5.1)
SODIUM: 136 mmol/L (ref 135–145)

## 2016-09-13 LAB — GLUCOSE, CAPILLARY: Glucose-Capillary: 97 mg/dL (ref 65–99)

## 2016-09-13 NOTE — Progress Notes (Signed)
Referring Physician(s): Dr. Zannie Cove  Supervising Physician: Ruel Favors  Patient Status:  Daniel Berger - In-pt  Chief Complaint:  Diverticulitis with abscess  Subjective:  Daniel Berger continues to feel better. He tells me he really has no pain at all now.  He also reports to me he is tolerating a full liquid diet and hopes to go home tomorrow. He reports drain was just recently flushed.  Allergies: Patient has no known allergies.  Medications: Prior to Admission medications   Medication Sig Start Date End Date Taking? Authorizing Provider  albuterol (PROVENTIL HFA;VENTOLIN HFA) 108 (90 Base) MCG/ACT inhaler Inhale 2 puffs into the lungs every 6 (six) hours as needed for wheezing or shortness of breath.   Yes Historical Provider, MD  Magnesium Hydroxide (MILK OF MAGNESIA PO) Take 60 mLs by mouth daily as needed (diarrhea).   Yes Historical Provider, MD     Vital Signs: BP 113/65 (BP Location: Left Arm)   Pulse 88   Temp 98.3 F (36.8 C) (Oral)   Resp 19   Ht 5\' 7"  (1.702 m)   Wt 130 lb 8.2 oz (59.2 kg)   SpO2 97%   BMI 20.44 kg/m   Physical Exam Awake and alert NAD Abdomen soft, NTND No output in gravity bag ~30 mL output recorded Drainage appears to be blood tinged (in tubing)   Imaging: Ct Abdomen Pelvis W Contrast  Result Date: 09/09/2016 CLINICAL DATA:  Abdominal pain for 1 week. EXAM: CT ABDOMEN AND PELVIS WITH CONTRAST TECHNIQUE: Multidetector CT imaging of the abdomen and pelvis was performed using the standard protocol following bolus administration of intravenous contrast. CONTRAST:  ISOVUE-300 IOPAMIDOL (ISOVUE-300) INJECTION 61% COMPARISON:  None. FINDINGS: Lower chest: Lung bases are clear. Hepatobiliary: No focal liver abnormality is seen. No gallstones, gallbladder wall thickening, or biliary dilatation. Pancreas: Unremarkable. No pancreatic ductal dilatation or surrounding inflammatory changes. Spleen: Normal in size without focal  abnormality. Adrenals/Urinary Tract: Adrenal glands are unremarkable. Kidneys are normal, without renal calculi, focal lesion, or hydronephrosis. Bladder is unremarkable. Stomach/Bowel: Stomach, small bowel, and colon are not abnormally distended. Diverticulosis of the sigmoid colon. Thickened wall of the sigmoid colon and rectum. Circumscribed fluid collection with thick wall and peripheral enhancement demonstrated in the space between the rectum and bladder. There is a tiny gas bubble. This is consistent with an abscess. This probably represents changes related to diverticulitis with localized perforation leading to a loculated abscess. The abscess cavity measures about 5.1 x 7.1 x 7.3 cm. The appendix is segmentally visualized and appears normal. Vascular/Lymphatic: No significant vascular findings are present. No enlarged abdominal or pelvic lymph nodes. Reproductive: Prostate is unremarkable. Other: No free air or free fluid in the abdomen. Abdominal wall musculature appears intact. Musculoskeletal: No acute or significant osseous findings. IMPRESSION: There is a large abscess in the deep pelvis between the rectum and bladder. Diverticulosis of the sigmoid colon with thick wall of the rectum and sigmoid colon. Likely this represents diverticulitis with localized perforation leading to abscess formation. Electronically Signed   By: Burman Nieves M.D.   On: 09/09/2016 22:44   Ct Image Guided Drainage By Percutaneous Catheter  Result Date: 09/10/2016 INDICATION: Diverticular abscess. Please perform CT-guided percutaneous drainage catheter placement for infection source control. EXAM: CT IMAGE GUIDED DRAINAGE BY PERCUTANEOUS CATHETER COMPARISON:  CT abdomen and pelvis - 07/10/2017 MEDICATIONS: The patient is currently admitted to the hospital and receiving intravenous antibiotics. The antibiotics were administered within an appropriate time frame prior to the  initiation of the procedure. ANESTHESIA/SEDATION:  Moderate (conscious) sedation was employed during this procedure. A total of Versed 3.5 mg and Fentanyl 175 mcg was administered intravenously. Moderate Sedation Time: 35 minutes. The patient's level of consciousness and vital signs were monitored continuously by radiology nursing throughout the procedure under my direct supervision. CONTRAST:  None COMPLICATIONS: None immediate. PROCEDURE: Informed written consent was obtained from the patient after a discussion of the risks, benefits and alternatives to treatment. The patient was placed supine on the CT gantry and a pre procedural CT was performed re-demonstrating the known abscess/fluid collection within the lower pelvis with dominant component measuring approximately 7.4 x 5.1 cm (image 32, series 8). The procedure was planned. A timeout was performed prior to the initiation of the procedure. The skin overlying the anterior aspect of the left lower abdomen/pelvis was prepped and draped in the usual sterile fashion. The overlying soft tissues were anesthetized with 1% lidocaine with epinephrine. Appropriate trajectory was planned with the use of a 22 gauge spinal needle. An 18 gauge trocar needle was advanced into the abscess/fluid collection and a short Amplatz super stiff wire was coiled within the collection. Appropriate positioning was confirmed with a limited CT scan. The tract was serially dilated however initially there was difficulty advancing the percutaneous drainage catheter into the well-formed pelvic abscess. Ultimately, initial attempt at drainage catheter placement proved unsuccessful. Again, an 18 gauge trocar needle was advanced into the abscess/fluid collection and a short Amplatz super stiff wire was coiled within the collection. Appropriate positioning was confirmed with a limited CT scan. The tract was serially dilated allowing placement of a 10 [place] all-purpose drainage catheter. Appropriate positioning was confirmed with a limited  postprocedural CT scan. Approximately 300 cc of purulent, foul smelling fluid fluid was aspirated. The tube was connected to a drainage bag and sutured in place. A dressing was placed. The patient tolerated the procedure well without immediate post procedural complication. IMPRESSION: Successful CT guided placement of a 10 French all purpose drain catheter into the pelvic diverticular abscess via anterior left lower abdominal/pelvic approach with aspiration of 300 cc of purulent, foul smelling fluid. Samples were sent to the laboratory as requested by the ordering clinical team. Electronically Signed   By: Simonne ComeJohn  Watts M.D.   On: 09/10/2016 14:59    Labs:  CBC:  Recent Labs  09/10/16 0451 09/11/16 0315 09/12/16 0545 09/13/16 0424  WBC 10.7* 11.1* 8.8 7.2  HGB 12.1* 11.9* 12.0* 11.5*  HCT 36.1* 35.7* 36.0* 33.9*  PLT 444* 411* 410* 407*    COAGS:  Recent Labs  09/10/16 0210  INR 1.30  APTT 37*    BMP:  Recent Labs  09/10/16 0451 09/11/16 0315 09/12/16 0545 09/13/16 0424  NA 135 135 136 136  K 3.9 3.8 3.9 4.1  CL 102 102 100* 105  CO2 24 23 22 23   GLUCOSE 93 82 75 88  BUN 6 8 7  <5*  CALCIUM 8.8* 8.6* 8.9 8.6*  CREATININE 0.84 1.09 0.92 0.81  GFRNONAA >60 >60 >60 >60  GFRAA >60 >60 >60 >60    LIVER FUNCTION TESTS:  Recent Labs  09/09/16 1317  BILITOT 0.4  AST 14*  ALT 22  ALKPHOS 57  PROT 7.6  ALBUMIN 3.0*    Assessment and Plan:  Diverticulitis with abscess, s/p perc drain by Dr. Grace IsaacWatts on 09/10/2016  Drain will need to stay in for at least a week generally before repeat CT scan.   If still present, would plan  for repeat CT towards the end of the week.   If he is discharged prior to that, then we would bring him back to the drain clinic next week for a repeat CT and drain evaluation.  Will follow and arrange drain clinic appointment upon discharge  Continue routine drain care and flushes.  Nursing should educate patient on drain care and  flushes.  Electronically Signed: Gwynneth Macleod PA-C 09/13/2016, 9:54 AM   I spent a total of 15 Minutes at the the patient's bedside AND on the patient's hospital floor or unit, greater than 50% of which was counseling/coordinating care for f/u perc drain.

## 2016-09-13 NOTE — Progress Notes (Signed)
PROGRESS NOTE    Blain PaisShurcono A Pellecchia  WUJ:[id number] DOB: 08/31/1980 DOA: 09/09/2016 PCP: No PCP Per Patient   Brief Narrative: 35/M with history of asthma/alcohol abuse admitted with abdominal pain and diarrhea for 1-2 weeks, found to have acute diverticulitis with perf and deep pelvic abscess S/p Perc drain per IR 1/13  Assessment & Plan:  1. Acute diverticulitis with perforation and large abscess in the deep pelvis -Continue IV ciprofloxacin and Flagyl,  IV fluids -CCS consulting, tolerated clears, on full liquid now -IR consulted, s/p percutaneous abscess drainage 1/13 CT guided placed of a 10 Fr drainage catheter placement into the pelvic abscess via L anterior lower abd/pelvic approach yielding 160 cc of purulent, foul smelling fluid -very minimal drain output -Timing of Repeat CT per IR/CCS  2. Alcohol abuse -Counseled, continue thiamine, monitor on CIWA protocol, no evidence of withdrawal yet  DVT prophylaxis: lovenox Code Status:Full Code Family Communication:None at bedside Disposition Plan:Home when improved   Consultants:   CCS  IR  Procedure:    Subjective: Feels ok, ready to eat/drink  Objective: Vitals:   09/11/16 2121 09/12/16 0610 09/12/16 1358 09/13/16 0636  BP: 129/81 131/86 119/76 113/65  Pulse: 66 63 88 88  Resp: 17 18 18 19   Temp: 98.9 F (37.2 C) 98.4 F (36.9 C) 98.1 F (36.7 C) 98.3 F (36.8 C)  TempSrc: Oral Oral Oral Oral  SpO2: 99% 97% 97% 97%  Weight:      Height:        Intake/Output Summary (Last 24 hours) at 09/13/16 1158 Last data filed at 09/13/16 1104  Gross per 24 hour  Intake             3760 ml  Output             1580 ml  Net             2180 ml   Filed Weights   09/10/16 0152  Weight: 59.2 kg (130 lb 8.2 oz)    Examination:  General exam: Appears calm and comfortable, AAOx3 Respiratory system: Clear to auscultation. Respiratory effort normal. Cardiovascular system: S1 & S2 heard, RRR. No pedal  edema. Gastrointestinal system: Abdomen is soft and nontender, increased BS. Drain noted Central nervous system: Alert and oriented. No focal neurological deficits. Extremities: Symmetric 5 x 5 power. Skin: No rashes, lesions or ulcers Psychiatry: Judgement and insight appear normal. Mood & affect appropriate.     Data Reviewed: I have personally reviewed following labs and imaging studies  CBC:  Recent Labs Lab 09/09/16 1317 09/10/16 0451 09/11/16 0315 09/12/16 0545 09/13/16 0424  WBC 12.0* 10.7* 11.1* 8.8 7.2  HGB 12.3* 12.1* 11.9* 12.0* 11.5*  HCT 36.5* 36.1* 35.7* 36.0* 33.9*  MCV 90.1 89.6 90.6 90.0 89.9  PLT 440* 444* 411* 410* 407*   Basic Metabolic Panel:  Recent Labs Lab 09/09/16 1317 09/10/16 0451 09/11/16 0315 09/12/16 0545 09/13/16 0424  NA 137 135 135 136 136  K 4.3 3.9 3.8 3.9 4.1  CL 101 102 102 100* 105  CO2 25 24 23 22 23   GLUCOSE 96 93 82 75 88  BUN 5* 6 8 7  <5*  CREATININE 1.02 0.84 1.09 0.92 0.81  CALCIUM 8.9 8.8* 8.6* 8.9 8.6*   GFR: Estimated Creatinine Clearance: 105.6 mL/min (by C-G formula based on SCr of 0.81 mg/dL). Liver Function Tests:  Recent Labs Lab 09/09/16 1317  AST 14*  ALT 22  ALKPHOS 57  BILITOT 0.4  PROT 7.6  ALBUMIN  3.0*    Recent Labs Lab 09/09/16 1317  LIPASE 25   No results for input(s): AMMONIA in the last 168 hours. Coagulation Profile:  Recent Labs Lab 09/10/16 0210  INR 1.30   Cardiac Enzymes: No results for input(s): CKTOTAL, CKMB, CKMBINDEX, TROPONINI in the last 168 hours. BNP (last 3 results) No results for input(s): PROBNP in the last 8760 hours. HbA1C: No results for input(s): HGBA1C in the last 72 hours. CBG:  Recent Labs Lab 09/11/16 0821 09/12/16 0801 09/13/16 0755  GLUCAP 77 88 97   Lipid Profile: No results for input(s): CHOL, HDL, LDLCALC, TRIG, CHOLHDL, LDLDIRECT in the last 72 hours. Thyroid Function Tests: No results for input(s): TSH, T4TOTAL, FREET4, T3FREE,  THYROIDAB in the last 72 hours. Anemia Panel: No results for input(s): VITAMINB12, FOLATE, FERRITIN, TIBC, IRON, RETICCTPCT in the last 72 hours. Urine analysis:    Component Value Date/Time   COLORURINE YELLOW 09/09/2016 1301   APPEARANCEUR CLEAR 09/09/2016 1301   LABSPEC 1.018 09/09/2016 1301   PHURINE 5.0 09/09/2016 1301   GLUCOSEU NEGATIVE 09/09/2016 1301   HGBUR NEGATIVE 09/09/2016 1301   BILIRUBINUR NEGATIVE 09/09/2016 1301   KETONESUR NEGATIVE 09/09/2016 1301   PROTEINUR NEGATIVE 09/09/2016 1301   NITRITE NEGATIVE 09/09/2016 1301   LEUKOCYTESUR NEGATIVE 09/09/2016 1301   Sepsis Labs: @LABRCNTIP (procalcitonin:4,lacticidven:4)  ) Recent Results (from the past 240 hour(s))  Culture, blood (x 2)     Status: None (Preliminary result)   Collection Time: 09/10/16  2:18 AM  Result Value Ref Range Status   Specimen Description BLOOD LEFT ARM  Final   Special Requests BOTTLES DRAWN AEROBIC AND ANAEROBIC  Final   Culture NO GROWTH 2 DAYS  Final   Report Status PENDING  Incomplete  Culture, blood (x 2)     Status: None (Preliminary result)   Collection Time: 09/10/16  2:32 AM  Result Value Ref Range Status   Specimen Description BLOOD LEFT HAND  Final   Special Requests IN PEDIATRIC BOTTLE  Final   Culture NO GROWTH 2 DAYS  Final   Report Status PENDING  Incomplete  C difficile quick scan w PCR reflex     Status: None   Collection Time: 09/10/16  7:34 AM  Result Value Ref Range Status   C Diff antigen NEGATIVE NEGATIVE Final   C Diff toxin NEGATIVE NEGATIVE Final   C Diff interpretation No C. difficile detected.  Final  Aerobic/Anaerobic Culture (surgical/deep wound)     Status: None (Preliminary result)   Collection Time: 09/10/16  2:44 PM  Result Value Ref Range Status   Specimen Description ABSCESS DRAINAGE  Final   Special Requests DIVERTICULAR ABSCESS  Final   Gram Stain   Final    ABUNDANT WBC PRESENT, PREDOMINANTLY PMN ABUNDANT GRAM POSITIVE COCCI IN  CHAINS ABUNDANT GRAM POSITIVE COCCI IN PAIRS ABUNDANT GRAM POSITIVE COCCI IN CLUSTERS ABUNDANT GRAM NEGATIVE RODS    Culture   Final    CULTURE REINCUBATED FOR BETTER GROWTH HOLDING FOR POSSIBLE ANAEROBE    Report Status PENDING  Incomplete         Radiology Studies: No results found.      Scheduled Meds: . ciprofloxacin  400 mg Intravenous Q12H  . enoxaparin (LOVENOX) injection  40 mg Subcutaneous Q24H  . folic acid  1 mg Oral Daily  . metronidazole  500 mg Intravenous Q8H  . multivitamin with minerals  1 tablet Oral Daily  . thiamine  100 mg Oral Daily   Or  .  thiamine  100 mg Intravenous Daily   Continuous Infusions: . sodium chloride 75 mL/hr at 09/13/16 0550     LOS: 3 days    Time spent:    Zannie Cove, MD [facility] Pager [phone]  If 7PM-7AM, please contact night-coverage [email or web address] Password [email or web address] 09/13/2016, 11:58 AM

## 2016-09-13 NOTE — Progress Notes (Signed)
[facility] Progress Note     Subjective: Pt states his pain is improved from yesterday. He did not take any pain medication overnight. He had a BM this morning. No blood in his stool and no increase in pain during or after BM. No fever, chills, nausea or vomiting overnight. He is tolerating the clear liquid diet. He is ambulating in the halls.   Objective: Vital signs in last 24 hours: Temp:  [98.1 F (36.7 C)-98.3 F (36.8 C)] 98.3 F (36.8 C) (01/16 0636) Pulse Rate:  [88] 88 (01/16 0636) Resp:  [18-19] 19 (01/16 0636) BP: (113-119)/(65-76) 113/65 (01/16 0636) SpO2:  [97 %] 97 % (01/16 0636) Last BM Date: 09/11/16  Intake/Output from previous day: 01/15 0701 - 01/16 0700 In: 3660 [P.O.:1560; I.V.:1790; IV Piggyback:300] Out: 1480 [Urine:1450; Drains:30] Intake/Output this shift: No intake/output data recorded.  PE: Gen:  Alert, NAD, pleasant, cooperative, lying in bed Card:  RRR, no M/G/R heard Pulm:  effort and rate normal Abd: Soft, non distended, +BS, mild TTP to suprapubic region and LLQ. Drain in place, bag with scant sanguinous drainage Skin: no rashes noted, warm and dry  Lab Results:   Recent Labs  09/12/16 0545 09/13/16 0424  WBC 8.8 7.2  HGB 12.0* 11.5*  HCT 36.0* 33.9*  PLT 410* 407*   BMET  Recent Labs  09/12/16 0545 09/13/16 0424  NA 136 136  K 3.9 4.1  CL 100* 105  CO2 22 23  GLUCOSE 75 88  BUN 7 <5*  CREATININE 0.92 0.81  CALCIUM 8.9 8.6*   PT/INR No results for input(s): LABPROT, INR in the last 72 hours. CMP     Component Value Date/Time   NA 136 09/13/2016 0424   K 4.1 09/13/2016 0424   CL 105 09/13/2016 0424   CO2 23 09/13/2016 0424   GLUCOSE 88 09/13/2016 0424   BUN <5 (L) 09/13/2016 0424   CREATININE 0.81 09/13/2016 0424   CALCIUM 8.6 (L) 09/13/2016 0424   PROT 7.6 09/09/2016 1317   ALBUMIN 3.0 (L) 09/09/2016 1317   AST 14 (L) 09/09/2016 1317   ALT 22 09/09/2016 1317   ALKPHOS 57 09/09/2016 1317    BILITOT 0.4 09/09/2016 1317   GFRNONAA >60 09/13/2016 0424   GFRAA >60 09/13/2016 0424   Lipase     Component Value Date/Time   LIPASE 25 09/09/2016 1317       Studies/Results: No results found.  Anti-infectives: Anti-infectives    Start     Dose/Rate Route Frequency Ordered Stop   09/10/16 1200  ciprofloxacin (CIPRO) IVPB 400 mg     400 mg 200 mL/hr over 60 Minutes Intravenous Every 12 hours 09/10/16 0023     09/10/16 0030  metroNIDAZOLE (FLAGYL) IVPB 500 mg     500 mg 100 mL/hr over 60 Minutes Intravenous Every 8 hours 09/10/16 0017     09/10/16 0030  ciprofloxacin (CIPRO) IVPB 400 mg  Status:  Discontinued     400 mg 200 mL/hr over 60 Minutes Intravenous  Once 09/10/16 0023 09/10/16 0050   09/10/16 0000  piperacillin-tazobactam (ZOSYN) IVPB 3.375 g  Status:  Discontinued     3.375 g 12.5 mL/hr over 240 Minutes Intravenous  Once 09/09/16 2355 09/10/16 0016       Assessment/Plan  Sigmoid diverticulitis with abscess - CT shows sigmoid diverticulitis with localized perforation and a large abscess in the deep pelvis between the rectum and bladder - WBC WNL; afebrile overnight - blood cultures no growth to date -  s/p IR drain placement 1/13, culture pending  Alcohol use - CIWA Asthma - PRN albuterol nebs  ID - IV cipro/flagyl 1/13>> FEN - IVF, soft diet VTE - SCDs & lovenox  Plan - Abdominal pain slightly improved from yesterday.  S/p IR drain placement 1/13, culture pending. WBC WNL and afebrile overnight  Continue IV antibiotics, with improvement in pain and normal WBC, will advance to soft diet and                              see how he tolerates them. If he has increase in pain then back to clears. CBC in AM.     LOS: 3 days    Jerre SimonJessica L Kaulana Brindle , [facility] 09/13/2016, 7:38 AM Pager: [phone] Consults: [phone] Mon-Fri 7:00 am-4:30 pm Sat-Sun 7:00 am-11:30  am

## 2016-09-14 LAB — AEROBIC/ANAEROBIC CULTURE W GRAM STAIN (SURGICAL/DEEP WOUND)

## 2016-09-14 LAB — CBC
HCT: 37 % — ABNORMAL LOW (ref 39.0–52.0)
Hemoglobin: 12 g/dL — ABNORMAL LOW (ref 13.0–17.0)
MCH: 29.3 pg (ref 26.0–34.0)
MCHC: 32.4 g/dL (ref 30.0–36.0)
MCV: 90.5 fL (ref 78.0–100.0)
PLATELETS: 438 10*3/uL — AB (ref 150–400)
RBC: 4.09 MIL/uL — ABNORMAL LOW (ref 4.22–5.81)
RDW: 13 % (ref 11.5–15.5)
WBC: 6.6 10*3/uL (ref 4.0–10.5)

## 2016-09-14 LAB — AEROBIC/ANAEROBIC CULTURE (SURGICAL/DEEP WOUND)

## 2016-09-14 LAB — BASIC METABOLIC PANEL
Anion gap: 10 (ref 5–15)
CALCIUM: 8.9 mg/dL (ref 8.9–10.3)
CO2: 22 mmol/L (ref 22–32)
CREATININE: 0.8 mg/dL (ref 0.61–1.24)
Chloride: 107 mmol/L (ref 101–111)
Glucose, Bld: 87 mg/dL (ref 65–99)
Potassium: 4.3 mmol/L (ref 3.5–5.1)
SODIUM: 139 mmol/L (ref 135–145)

## 2016-09-14 LAB — GLUCOSE, CAPILLARY: Glucose-Capillary: 101 mg/dL — ABNORMAL HIGH (ref 65–99)

## 2016-09-14 MED ORDER — METRONIDAZOLE 500 MG PO TABS
500.0000 mg | ORAL_TABLET | Freq: Three times a day (TID) | ORAL | Status: DC
  Administered 2016-09-14 – 2016-09-16 (×7): 500 mg via ORAL
  Filled 2016-09-14 (×7): qty 1

## 2016-09-14 MED ORDER — CIPROFLOXACIN HCL 500 MG PO TABS
500.0000 mg | ORAL_TABLET | Freq: Two times a day (BID) | ORAL | Status: DC
  Administered 2016-09-14 – 2016-09-16 (×5): 500 mg via ORAL
  Filled 2016-09-14 (×5): qty 1

## 2016-09-14 NOTE — Progress Notes (Signed)
PROGRESS NOTE    Daniel Berger  RUE:[id number] DOB: [birth year] DOA: 09/09/2016 PCP: No PCP Per Patient   Brief Narrative: 35/M with history of asthma/alcohol abuse admitted with abdominal pain and diarrhea for 1-2 weeks, found to have acute diverticulitis with perf and deep pelvic abscess S/p Perc drain per IR 1/13  Assessment & Plan:  1. Acute diverticulitis with perforation and large abscess in the deep pelvis -On IV ciprofloxacin and Flagyl,  IV fluids, change Abx to Po -CCS consulting -IR consulted, s/p percutaneous abscess drainage 1/13 CT guided placed of a 10 Fr drainage catheter placement into the pelvic abscess via L anterior lower abd/pelvic approach yielding 160 cc of purulent, foul smelling fluid -very minimal drain output now, ? Repeat CT and drain removal-defer to IR/CCS -now on solids -DC plans per CCS/IR  2. Alcohol abuse -Counseled, continue thiamine, monitor on CIWA protocol, no evidence of withdrawal yet  DVT prophylaxis: lovenox Code Status:Full Code Family Communication:None at bedside Disposition Plan:Home ? tomorrow  Consultants:   CCS  IR  Procedure:    Subjective: Feels ok, ready to eat/drink  Objective: Vitals:   09/13/16 0636 09/13/16 1328 09/13/16 2031 09/14/16 0500  BP: 113/65 119/78 103/79 105/88  Pulse: 88 70 88 87  Resp: 19 18 19 19   Temp: 98.3 F (36.8 C) 98.2 F (36.8 C) 98.6 F (37 C) 97.9 F (36.6 C)  TempSrc: Oral Oral Oral Oral  SpO2: 97% 99% 99% 99%  Weight:      Height:        Intake/Output Summary (Last 24 hours) at 09/14/16 1150 Last data filed at 09/14/16 1002  Gross per 24 hour  Intake           2702.5 ml  Output             1450 ml  Net           1252.5 ml   Filed Weights   09/10/16 0152  Weight: 59.2 kg (130 lb 8.2 oz)    Examination:  General exam: Appears calm and comfortable, AAOx3 Respiratory system: Clear to auscultation. Respiratory effort normal. Cardiovascular system: S1 & S2 heard, RRR. No  pedal edema. Gastrointestinal system: Abdomen is soft and nontender, increased BS. Drain noted Central nervous system: Alert and oriented. No focal neurological deficits. Extremities: Symmetric 5 x 5 power. Skin: No rashes, lesions or ulcers Psychiatry: Judgement and insight appear normal. Mood & affect appropriate.     Data Reviewed: I have personally reviewed following labs and imaging studies  CBC:  Recent Labs Lab 09/10/16 0451 09/11/16 0315 09/12/16 0545 09/13/16 0424 09/14/16 0453  WBC 10.7* 11.1* 8.8 7.2 6.6  HGB 12.1* 11.9* 12.0* 11.5* 12.0*  HCT 36.1* 35.7* 36.0* 33.9* 37.0*  MCV 89.6 90.6 90.0 89.9 90.5  PLT 444* 411* 410* 407* 438*   Basic Metabolic Panel:  Recent Labs Lab 09/10/16 0451 09/11/16 0315 09/12/16 0545 09/13/16 0424 09/14/16 0453  NA 135 135 136 136 139  K 3.9 3.8 3.9 4.1 4.3  CL 102 102 100* 105 107  CO2 24 23 22 23 22   GLUCOSE 93 82 75 88 87  BUN 6 8 7  <5* <5*  CREATININE 0.84 1.09 0.92 0.81 0.80  CALCIUM 8.8* 8.6* 8.9 8.6* 8.9   GFR: Estimated Creatinine Clearance: 106.9 mL/min (by C-G formula based on SCr of 0.8 mg/dL). Liver Function Tests:  Recent Labs Lab 09/09/16 1317  AST 14*  ALT 22  ALKPHOS 57  BILITOT 0.4  PROT  7.6  ALBUMIN 3.0*    Recent Labs Lab 09/09/16 1317  LIPASE 25   No results for input(s): AMMONIA in the last 168 hours. Coagulation Profile:  Recent Labs Lab 09/10/16 0210  INR 1.30   Cardiac Enzymes: No results for input(s): CKTOTAL, CKMB, CKMBINDEX, TROPONINI in the last 168 hours. BNP (last 3 results) No results for input(s): PROBNP in the last 8760 hours. HbA1C: No results for input(s): HGBA1C in the last 72 hours. CBG:  Recent Labs Lab 09/11/16 0821 09/12/16 0801 09/13/16 0755 09/14/16 0744  GLUCAP 77 88 97 101*   Lipid Profile: No results for input(s): CHOL, HDL, LDLCALC, TRIG, CHOLHDL, LDLDIRECT in the last 72 hours. Thyroid Function Tests: No results for input(s): TSH, T4TOTAL,  FREET4, T3FREE, THYROIDAB in the last 72 hours. Anemia Panel: No results for input(s): VITAMINB12, FOLATE, FERRITIN, TIBC, IRON, RETICCTPCT in the last 72 hours. Urine analysis:    Component Value Date/Time   COLORURINE YELLOW 09/09/2016 1301   APPEARANCEUR CLEAR 09/09/2016 1301   LABSPEC 1.018 09/09/2016 1301   PHURINE 5.0 09/09/2016 1301   GLUCOSEU NEGATIVE 09/09/2016 1301   HGBUR NEGATIVE 09/09/2016 1301   BILIRUBINUR NEGATIVE 09/09/2016 1301   KETONESUR NEGATIVE 09/09/2016 1301   PROTEINUR NEGATIVE 09/09/2016 1301   NITRITE NEGATIVE 09/09/2016 1301   LEUKOCYTESUR NEGATIVE 09/09/2016 1301   Sepsis Labs: @LABRCNTIP (procalcitonin:4,lacticidven:4)  ) Recent Results (from the past 240 hour(s))  Culture, blood (x 2)     Status: None (Preliminary result)   Collection Time: 09/10/16  2:18 AM  Result Value Ref Range Status   Specimen Description BLOOD LEFT ARM  Final   Special Requests BOTTLES DRAWN AEROBIC AND ANAEROBIC  Final   Culture NO GROWTH 3 DAYS  Final   Report Status PENDING  Incomplete  Culture, blood (x 2)     Status: None (Preliminary result)   Collection Time: 09/10/16  2:32 AM  Result Value Ref Range Status   Specimen Description BLOOD LEFT HAND  Final   Special Requests IN PEDIATRIC BOTTLE  Final   Culture NO GROWTH 3 DAYS  Final   Report Status PENDING  Incomplete  C difficile quick scan w PCR reflex     Status: None   Collection Time: 09/10/16  7:34 AM  Result Value Ref Range Status   C Diff antigen NEGATIVE NEGATIVE Final   C Diff toxin NEGATIVE NEGATIVE Final   C Diff interpretation No C. difficile detected.  Final  Aerobic/Anaerobic Culture (surgical/deep wound)     Status: None (Preliminary result)   Collection Time: 09/10/16  2:44 PM  Result Value Ref Range Status   Specimen Description ABSCESS DRAINAGE  Final   Special Requests DIVERTICULAR ABSCESS  Final   Gram Stain   Final    ABUNDANT WBC PRESENT, PREDOMINANTLY PMN ABUNDANT GRAM POSITIVE  COCCI IN CHAINS ABUNDANT GRAM POSITIVE COCCI IN PAIRS ABUNDANT GRAM POSITIVE COCCI IN CLUSTERS ABUNDANT GRAM NEGATIVE RODS    Culture   Final    MODERATE ESCHERICHIA COLI SUSCEPTIBILITIES TO FOLLOW HOLDING FOR POSSIBLE ANAEROBE    Report Status PENDING  Incomplete         Radiology Studies: No results found.      Scheduled Meds: . ciprofloxacin  400 mg Intravenous Q12H  . enoxaparin (LOVENOX) injection  40 mg Subcutaneous Q24H  . folic acid  1 mg Oral Daily  . metronidazole  500 mg Intravenous Q8H  . multivitamin with minerals  1 tablet Oral Daily  . thiamine  100  mg Oral Daily   Or  . thiamine  100 mg Intravenous Daily   Continuous Infusions: . sodium chloride 75 mL/hr at 09/13/16 2200     LOS: 4 days    Time spent: 25min    Zannie CovePreetha Emmalin Jaquess, MD [facility] Pager [phone]  If 7PM-7AM, please contact night-coverage [email or web address] Password [email or web address] 09/14/2016, 11:50 AM

## 2016-09-15 ENCOUNTER — Inpatient Hospital Stay (HOSPITAL_COMMUNITY): Payer: Self-pay

## 2016-09-15 ENCOUNTER — Encounter (HOSPITAL_COMMUNITY): Payer: Self-pay | Admitting: Diagnostic Radiology

## 2016-09-15 HISTORY — PX: IR GENERIC HISTORICAL: IMG1180011

## 2016-09-15 LAB — CULTURE, BLOOD (ROUTINE X 2)
CULTURE: NO GROWTH
Culture: NO GROWTH

## 2016-09-15 LAB — CBC
HEMATOCRIT: 38.9 % — AB (ref 39.0–52.0)
Hemoglobin: 12.8 g/dL — ABNORMAL LOW (ref 13.0–17.0)
MCH: 30.2 pg (ref 26.0–34.0)
MCHC: 32.9 g/dL (ref 30.0–36.0)
MCV: 91.7 fL (ref 78.0–100.0)
PLATELETS: 474 10*3/uL — AB (ref 150–400)
RBC: 4.24 MIL/uL (ref 4.22–5.81)
RDW: 13.3 % (ref 11.5–15.5)
WBC: 5.5 10*3/uL (ref 4.0–10.5)

## 2016-09-15 LAB — GLUCOSE, CAPILLARY: Glucose-Capillary: 85 mg/dL (ref 65–99)

## 2016-09-15 MED ORDER — IOPAMIDOL (ISOVUE-300) INJECTION 61%
INTRAVENOUS | Status: AC
  Administered 2016-09-15: 5 mL
  Filled 2016-09-15: qty 50

## 2016-09-15 MED ORDER — LIDOCAINE HCL 1 % IJ SOLN
INTRAMUSCULAR | Status: DC | PRN
  Administered 2016-09-15: 5 mL

## 2016-09-15 MED ORDER — LIDOCAINE HCL (PF) 1 % IJ SOLN
INTRAMUSCULAR | Status: AC
Start: 2016-09-15 — End: 2016-09-16
  Filled 2016-09-15: qty 5

## 2016-09-15 MED ORDER — IOPAMIDOL (ISOVUE-300) INJECTION 61%
INTRAVENOUS | Status: AC
  Administered 2016-09-15: 80 mL via INTRAVENOUS
  Filled 2016-09-15: qty 100

## 2016-09-15 NOTE — Progress Notes (Signed)
PROGRESS NOTE    Daniel Berger  ZOX:[id number] DOB: 05-May-1981 DOA: 09/09/2016 PCP: No PCP Per Patient   Brief Narrative: 35/M with history of asthma/alcohol abuse admitted with abdominal pain and diarrhea for 1-2 weeks, found to have acute diverticulitis with perf and deep pelvic abscess S/p Perc drain per IR 1/13  Assessment & Plan:  1. Acute diverticulitis with perforation and large abscess in the deep pelvis -CCS/IR consulting -s/p percutaneous abscess drainage 1/13 CT guided placed of a 10 Fr drainage catheter placement into the pelvic abscess via L anterior lower abd/pelvic approach yielding 160 cc of purulent, foul smelling fluid -very minimal drain output now, 5-10cc, d/w IR regarding repeat CT and possibility of drain removal -CT today to FU abscess -On Po cipro/flagyl, tolerating diet -if drain cannot come out will need to DC with drain and FU with IR in Drain clinic  2. Alcohol abuse -Counseled, continue thiamine, monitor on CIWA protocol, no evidence of withdrawal yet  DVT prophylaxis: lovenox Code Status:Full Code Family Communication:None at bedside Disposition Plan:Home pending drain removal  Consultants:   CCS  IR  Procedure:    Subjective: Feels ok, tolerating diet  Objective: Vitals:   09/14/16 0500 09/14/16 1500 09/14/16 2200 09/15/16 0455  BP: 105/88 112/82 129/79 118/88  Pulse: 87 74 69 76  Resp: 19 18 18    Temp: 97.9 F (36.6 C) 98.3 F (36.8 C) 98.4 F (36.9 C) 98.6 F (37 C)  TempSrc: Oral Oral Oral Oral  SpO2: 99% 100% 98% 100%  Weight:      Height:        Intake/Output Summary (Last 24 hours) at 09/15/16 1204 Last data filed at 09/15/16 1022  Gross per 24 hour  Intake           3242.5 ml  Output              615 ml  Net           2627.5 ml   Filed Weights   09/10/16 0152  Weight: 59.2 kg (130 lb 8.2 oz)    Examination:  General exam: Appears calm and comfortable, AAOx3 Respiratory system: Clear to auscultation.  Respiratory effort normal. Cardiovascular system: S1 & S2 heard, RRR. No pedal edema. Gastrointestinal system: Abdomen is soft and nontender, increased BS. Drain noted-minimal output Central nervous system: Alert and oriented. No focal neurological deficits. Extremities: Symmetric 5 x 5 power. Skin: No rashes, lesions or ulcers Psychiatry: Judgement and insight appear normal. Mood & affect appropriate.     Data Reviewed: I have personally reviewed following labs and imaging studies  CBC:  Recent Labs Lab 09/11/16 0315 09/12/16 0545 09/13/16 0424 09/14/16 0453 09/15/16 0531  WBC 11.1* 8.8 7.2 6.6 5.5  HGB 11.9* 12.0* 11.5* 12.0* 12.8*  HCT 35.7* 36.0* 33.9* 37.0* 38.9*  MCV 90.6 90.0 89.9 90.5 91.7  PLT 411* 410* 407* 438* 474*   Basic Metabolic Panel:  Recent Labs Lab 09/10/16 0451 09/11/16 0315 09/12/16 0545 09/13/16 0424 09/14/16 0453  NA 135 135 136 136 139  K 3.9 3.8 3.9 4.1 4.3  CL 102 102 100* 105 107  CO2 24 23 22 23 22   GLUCOSE 93 82 75 88 87  BUN 6 8 7  <5* <5*  CREATININE 0.84 1.09 0.92 0.81 0.80  CALCIUM 8.8* 8.6* 8.9 8.6* 8.9   GFR: Estimated Creatinine Clearance: 106.9 mL/min (by C-G formula based on SCr of 0.8 mg/dL). Liver Function Tests:  Recent Labs Lab 09/09/16 1317  AST  14*  ALT 22  ALKPHOS 57  BILITOT 0.4  PROT 7.6  ALBUMIN 3.0*    Recent Labs Lab 09/09/16 1317  LIPASE 25   No results for input(s): AMMONIA in the last 168 hours. Coagulation Profile:  Recent Labs Lab 09/10/16 0210  INR 1.30   Cardiac Enzymes: No results for input(s): CKTOTAL, CKMB, CKMBINDEX, TROPONINI in the last 168 hours. BNP (last 3 results) No results for input(s): PROBNP in the last 8760 hours. HbA1C: No results for input(s): HGBA1C in the last 72 hours. CBG:  Recent Labs Lab 09/11/16 0821 09/12/16 0801 09/13/16 0755 09/14/16 0744 09/15/16 0820  GLUCAP 77 88 97 101* 85   Lipid Profile: No results for input(s): CHOL, HDL, LDLCALC, TRIG,  CHOLHDL, LDLDIRECT in the last 72 hours. Thyroid Function Tests: No results for input(s): TSH, T4TOTAL, FREET4, T3FREE, THYROIDAB in the last 72 hours. Anemia Panel: No results for input(s): VITAMINB12, FOLATE, FERRITIN, TIBC, IRON, RETICCTPCT in the last 72 hours. Urine analysis:    Component Value Date/Time   COLORURINE YELLOW 09/09/2016 1301   APPEARANCEUR CLEAR 09/09/2016 1301   LABSPEC 1.018 09/09/2016 1301   PHURINE 5.0 09/09/2016 1301   GLUCOSEU NEGATIVE 09/09/2016 1301   HGBUR NEGATIVE 09/09/2016 1301   BILIRUBINUR NEGATIVE 09/09/2016 1301   KETONESUR NEGATIVE 09/09/2016 1301   PROTEINUR NEGATIVE 09/09/2016 1301   NITRITE NEGATIVE 09/09/2016 1301   LEUKOCYTESUR NEGATIVE 09/09/2016 1301   Sepsis Labs: @LABRCNTIP (procalcitonin:4,lacticidven:4)  ) Recent Results (from the past 240 hour(s))  Culture, blood (x 2)     Status: None (Preliminary result)   Collection Time: 09/10/16  2:18 AM  Result Value Ref Range Status   Specimen Description BLOOD LEFT ARM  Final   Special Requests BOTTLES DRAWN AEROBIC AND ANAEROBIC  Final   Culture NO GROWTH 4 DAYS  Final   Report Status PENDING  Incomplete  Culture, blood (x 2)     Status: None (Preliminary result)   Collection Time: 09/10/16  2:32 AM  Result Value Ref Range Status   Specimen Description BLOOD LEFT HAND  Final   Special Requests IN PEDIATRIC BOTTLE  Final   Culture NO GROWTH 4 DAYS  Final   Report Status PENDING  Incomplete  C difficile quick scan w PCR reflex     Status: None   Collection Time: 09/10/16  7:34 AM  Result Value Ref Range Status   C Diff antigen NEGATIVE NEGATIVE Final   C Diff toxin NEGATIVE NEGATIVE Final   C Diff interpretation No C. difficile detected.  Final  Aerobic/Anaerobic Culture (surgical/deep wound)     Status: None   Collection Time: 09/10/16  2:44 PM  Result Value Ref Range Status   Specimen Description ABSCESS DRAINAGE  Final   Special Requests DIVERTICULAR ABSCESS  Final    Gram Stain   Final    ABUNDANT WBC PRESENT, PREDOMINANTLY PMN ABUNDANT GRAM POSITIVE COCCI IN CHAINS ABUNDANT GRAM POSITIVE COCCI IN PAIRS ABUNDANT GRAM POSITIVE COCCI IN CLUSTERS ABUNDANT GRAM NEGATIVE RODS    Culture   Final    MODERATE ESCHERICHIA COLI MIXED ANAEROBIC FLORA PRESENT.  CALL LAB IF FURTHER IID REQUIRED.    Report Status 09/14/2016 FINAL  Final   Organism ID, Bacteria ESCHERICHIA COLI  Final      Susceptibility   Escherichia coli - MIC*    AMPICILLIN >=32 RESISTANT Resistant     CEFAZOLIN <=4 SENSITIVE Sensitive     CEFEPIME <=1 SENSITIVE Sensitive     CEFTAZIDIME <=1 SENSITIVE  Sensitive     CEFTRIAXONE <=1 SENSITIVE Sensitive     CIPROFLOXACIN <=0.25 SENSITIVE Sensitive     GENTAMICIN <=1 SENSITIVE Sensitive     IMIPENEM <=0.25 SENSITIVE Sensitive     TRIMETH/SULFA <=20 SENSITIVE Sensitive     AMPICILLIN/SULBACTAM 8 SENSITIVE Sensitive     PIP/TAZO <=4 SENSITIVE Sensitive     Extended ESBL NEGATIVE Sensitive     * MODERATE ESCHERICHIA COLI         Radiology Studies: No results found.      Scheduled Meds: . ciprofloxacin  500 mg Oral BID  . enoxaparin (LOVENOX) injection  40 mg Subcutaneous Q24H  . folic acid  1 mg Oral Daily  . metroNIDAZOLE  500 mg Oral Q8H  . multivitamin with minerals  1 tablet Oral Daily  . thiamine  100 mg Oral Daily   Continuous Infusions: . sodium chloride 50 mL/hr at 09/14/16 1209     LOS: 5 days    Time spent: 25min    Zannie CovePreetha Kikuye Korenek, MD [facility] Pager [phone]  If 7PM-7AM, please contact night-coverage [email or web address] Password [email or web address] 09/15/2016, 12:04 PM

## 2016-09-15 NOTE — Procedures (Signed)
The pelvic fluid collection has resolved by CT.  Initial drain injection showed no fistula connection to bowel.  Was planning to remove the drain over a wire because there is some blood along the drain tract and close proximity to the distal left ureter.  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  As a result, left drain in place.  Recommend continue drainage and flushing drain with no more than 5 ml/day.  Recommend a follow-up CT of abdomen and pelvis in 1-2 weeks with delayed images in the pelvis to evaluate drain relationship with left ureter.  Patient could have another drain injection at same time as CT.  Will get a renal US today because left ureter is slightly larger than right ureter on today's exam.  Want to rule out left hydronephrosis, although this is probably unlikely based on the fluoroscopic images from today.

## 2016-09-15 NOTE — Progress Notes (Signed)
    Referring Physician(s): Dr Mitchel HonourP Joseph  Supervising Physician: Richarda OverlieHenn, Adam  [facility]  Chief Complaint:  Pelvic abscess drain placed 1/13   Subjective:  Output slowing + ecoli Wbc wnl afeb Pt feeling better daily Eating soft diet  Allergies: Patient has no known allergies.  Medications: Prior to Admission medications   Medication Sig Start Date End Date Taking? Authorizing Provider  albuterol (PROVENTIL HFA;VENTOLIN HFA) 108 (90 Base) MCG/ACT inhaler Inhale 2 puffs into the lungs every 6 (six) hours as needed for wheezing or shortness of breath.   Yes Historical Provider, MD  Magnesium Hydroxide (MILK OF MAGNESIA PO) Take 60 mLs by mouth daily as needed (diarrhea).   Yes Historical Provider, MD     Vital Signs: BP 118/88 (BP Location: Right Arm)   Pulse 76   Temp 98.6 F (37 C) (Oral)   Resp 18   Ht 5\' 7"  (1.702 m)   Wt 130 lb 8.2 oz (59.2 kg)   SpO2 100%   BMI 20.44 kg/m   Physical Exam  Constitutional: He is oriented to person, place, and time.  Musculoskeletal: Normal range of motion.  Neurological: He is alert and oriented to person, place, and time.  Skin: Skin is warm and dry.  Skin site is clean and dry NT no bleeding OP bloody/ thin fluid  + Ecoli 15 cc yesterday 15 cc in bag    Psychiatric: He has a normal mood and affect. His behavior is normal.  Nursing note and vitals reviewed.   Imaging: No results found.  Labs:  CBC:  Recent Labs  09/12/16 0545 09/13/16 0424 09/14/16 0453 09/15/16 0531  WBC 8.8 7.2 6.6 5.5  HGB 12.0* 11.5* 12.0* 12.8*  HCT 36.0* 33.9* 37.0* 38.9*  PLT 410* 407* 438* 474*    COAGS:  Recent Labs  09/10/16 0210  INR 1.30  APTT 37*    BMP:  Recent Labs  09/11/16 0315 09/12/16 0545 09/13/16 0424 09/14/16 0453  NA 135 136 136 139  K 3.8 3.9 4.1 4.3  CL 102 100* 105 107  CO2 23 22 23 22   GLUCOSE 82 75 88 87  BUN 8 7 <5* <5*  CALCIUM 8.6* 8.9 8.6* 8.9  CREATININE 1.09  0.92 0.81 0.80  GFRNONAA >60 >60 >60 >60  GFRAA >60 >60 >60 >60    LIVER FUNCTION TESTS:  Recent Labs  09/09/16 1317  BILITOT 0.4  AST 14*  ALT 22  ALKPHOS 57  PROT 7.6  ALBUMIN 3.0*    Assessment and Plan:  Pelvic abscess drain intact OP minimal Afeb; wbc wnl Rec: re CT today Consider removal---- If goes home with drain---we will see in OP IR clinic  Electronically Signed: Annita Ratliff A 09/15/2016, 10:47 AM   I spent a total of 15 Minutes at the the patient's bedside AND on the patient's hospital floor or unit, greater than 50% of which was counseling/coordinating care for pelvic abscess drain

## 2016-09-16 DIAGNOSIS — J45998 Other asthma: Secondary | ICD-10-CM

## 2016-09-16 DIAGNOSIS — K5720 Diverticulitis of large intestine with perforation and abscess without bleeding: Principal | ICD-10-CM

## 2016-09-16 DIAGNOSIS — Z789 Other specified health status: Secondary | ICD-10-CM

## 2016-09-16 DIAGNOSIS — K651 Peritoneal abscess: Secondary | ICD-10-CM

## 2016-09-16 LAB — GLUCOSE, CAPILLARY: Glucose-Capillary: 113 mg/dL — ABNORMAL HIGH (ref 65–99)

## 2016-09-16 MED ORDER — CIPROFLOXACIN HCL 500 MG PO TABS: 500.0000 mg | ORAL_TABLET | Freq: Two times a day (BID) | ORAL | 0 refills | Status: DC

## 2016-09-16 MED ORDER — METRONIDAZOLE 500 MG PO TABS: 500.0000 mg | ORAL_TABLET | Freq: Three times a day (TID) | ORAL | 0 refills | Status: DC

## 2016-09-16 NOTE — Progress Notes (Signed)
[facility] Progress Note     Subjective: Pt states he has very minimal abdominal pain. He has not needed pain meds. He is tolerating his diet. No nausea or vomiting, fever or chills. He is having BM's.   Objective: Vital signs in last 24 hours: Temp:  [98.2 F (36.8 C)-98.5 F (36.9 C)] 98.2 F (36.8 C) (01/19 0500) Pulse Rate:  [63-78] 64 (01/19 0500) Resp:  [18-20] 19 (01/19 0500) BP: (109-115)/(66-78) 109/66 (01/19 0500) SpO2:  [98 %-99 %] 99 % (01/19 0500) Last BM Date: 09/15/16  Intake/Output from previous day: 01/18 0701 - 01/19 0700 In: 245 [P.O.:240] Out: 20 [Drains:20] Intake/Output this shift: No intake/output data recorded.  PE: Gen:  Alert, NAD, pleasant, cooperative, lying in bed Card:  RRR, no M/G/R heard Pulm:  effort and rate normal Abd: Soft, non distended, +BS, very mild TTP to suprapubic. Drain in place, bag with scant sanguinous drainage Skin: no rashes noted, warm and dry  Lab Results:   Recent Labs  09/14/16 0453 09/15/16 0531  WBC 6.6 5.5  HGB 12.0* 12.8*  HCT 37.0* 38.9*  PLT 438* 474*   BMET  Recent Labs  09/14/16 0453  NA 139  K 4.3  CL 107  CO2 22  GLUCOSE 87  BUN <5*  CREATININE 0.80  CALCIUM 8.9   PT/INR No results for input(s): LABPROT, INR in the last 72 hours. CMP     Component Value Date/Time   NA 139 09/14/2016 0453   K 4.3 09/14/2016 0453   CL 107 09/14/2016 0453   CO2 22 09/14/2016 0453   GLUCOSE 87 09/14/2016 0453   BUN <5 (L) 09/14/2016 0453   CREATININE 0.80 09/14/2016 0453   CALCIUM 8.9 09/14/2016 0453   PROT 7.6 09/09/2016 1317   ALBUMIN 3.0 (L) 09/09/2016 1317   AST 14 (L) 09/09/2016 1317   ALT 22 09/09/2016 1317   ALKPHOS 57 09/09/2016 1317   BILITOT 0.4 09/09/2016 1317   GFRNONAA >60 09/14/2016 0453   GFRAA >60 09/14/2016 0453   Lipase     Component Value Date/Time   LIPASE 25 09/09/2016 1317       Studies/Results: Ct Pelvis W Contrast  Result Date: 09/15/2016 CLINICAL  DATA:  Perirectal abscess.  Pelvic drain. EXAM: CT PELVIS WITH CONTRAST TECHNIQUE: Multidetector CT imaging of the pelvis was performed using the standard protocol following the bolus administration of intravenous contrast. CONTRAST:  1 ISOVUE-300 IOPAMIDOL (ISOVUE-300) INJECTION 61% COMPARISON:  09/09/2016 FINDINGS: Urinary Tract: Excreted contrast noted in the ureters, although there is poor filling of the left distal ureter. Inflammatory phlegmon and thickening along the left lateral and posterior bladder margin as on image 63/2. The pigtail catheter does pass in the vicinity of the left distal ureter, but I do not see an obvious leak of urine to suggest that the ureter is injured. Bowel: Sigmoid diverticulosis with inflammatory stranding in the vicinity suggesting active diverticulitis. On image 114/4, there is a small track containing some gas abutment to the left of the cul well of the pigtail catheter and extending up towards the proximal sigmoid colon. The possibility of a fistulous connection of the sigmoid colon with the abscess cavity is raised, correlate with any persistent drainage. Vascular/Lymphatic: Reactive inguinal and external iliac lymph nodes. Reproductive: Stranding along the left seminal vesicle with due to regional phlegmon. Other: Pigtail catheter coiled anterior to the rectum in just above the seminal vesicles in the vicinity of the prior large abscess. The abscess has been successfully  drained without well-defined residual fluid density although there is some residual inflammatory findings. There stranding along the course of the catheter to the left of the bladder which could be from inflammation or hematoma. Presacral edema similar to prior. Stranding along the perirectal space bilaterally. Musculoskeletal: Unremarkable IMPRESSION: 1. Significantly improved appearance with successful drainage of the pelvic abscess. There is some likely hematoma tracking along the pigtail catheter to the  left of the urinary bladder, less likely infectious/inflammatory phlegmon. The possibility of a small connection between the sigmoid colon and the drained abscess cavity is raised given several locules of gas in a track extending towards the pigtail catheter from the proximal sigmoid colon, see image 114/4. 2. The left distal ureter does not fill with contrast in the region of inflammation and possible hematoma. However, I do not see typical findings for a urinoma. Accordingly I doubt that the ureter it is injured. 3. Extensive sigmoid diverticulosis with reduced active diverticulitis. Reactive adenopathy in the pelvis. Electronically Signed   By: Gaylyn RongWalter  Liebkemann M.D.   On: 09/15/2016 13:25   Ir Sinus/fist Tube Chk-non Gi  Result Date: 09/15/2016 INDICATION: 36 year old with a diverticular abscess and underwent percutaneous drain placement on 09/10/2016. Recent CT demonstrated resolution of the pelvic abscess. Drain needs to be evaluated for possible bowel fistula. EXAM: SINUS TRACT INJECTION / FISTULOGRAM COMPARISON:  None. MEDICATIONS: None Fluoroscopy:  1 minutes and 49 seconds, 16 mGy ANESTHESIA/SEDATION: None COMPLICATIONS: None immediate. TECHNIQUE: Patient was placed supine on the interventional table. The drain was injected with approximately 5 ml of contrast. Initially, a bowel fistula was not identified and was planning to remove the drain. Planned to remove the drain over a wire because there was some blood products around the drain track on the recent CT. The catheter and surrounding skin were prepped and draped in a sterile fashion. Maximal barrier sterile technique was utilized including caps, mask, sterile gowns, sterile gloves, sterile drape, hand hygiene and skin antiseptic. The drain was injected a second time under fluoroscopy with 10 ml of contrast. 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 Therefore, the drain was not removed. Skin was anesthetized with 1% lidocaine and a new suture was  placed. Catheter was attached to gravity bag. PROCEDURE: The drain was injected with contrast. All the contrast was aspirated at the procedure. New suture was placed. FINDINGS: Small residual abscess cavity. Initially, a bowel fistula was not identified. Second injection demonstrated filling of bowel adjacent to the abscess cavity. Findings compatible with a colonic fistula. No abnormal contrast identified in the renal collecting systems or ureters. There is contrast within the urinary bladder from recent CT. 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 Therefore, the drain was not removed. Recent CT demonstrated that the drainage catheter is near the left ureter but no evidence for a ureter injury or urine leak. However, a renal ultrasound has been ordered to exclude any hydronephrosis. Recommend keeping the drain in place for 1-2 weeks. Plan for CT of the abdomen and pelvis with IV contrast with delayed images through the pelvis in 1-2 weeks. Following CT, patient could have another drain injection. Plan to keep the drain until the fistula has resolved. Electronically Signed   By: Richarda OverlieAdam  Henn M.D.   On: 09/15/2016 15:31   [place] Renal  Result Date: 09/15/2016 CLINICAL DATA:  Pelvic abscess with percutaneous pelvic drainage catheter. EXAM: RENAL / URINARY TRACT ULTRASOUND COMPLETE COMPARISON:  09/15/2016 pelvic CT, CT abdomen pelvis from 09/11/2015 FINDINGS: Right Kidney: Length: 11.2  cm. Echogenicity within normal limits. No mass or hydronephrosis visualized. No genitourinary calculus. Left Kidney: Length: 11.3 cm. Echogenicity within normal limits. No mass or hydronephrosis visualized. No genitourinary calculus. Bladder: Bladder is slightly thick-walled in appearance but this may be due to underdistention. Mild inflammation possibly representing cystitis is not entirely excluded. IMPRESSION: No obstructive uropathy. Slightly thick-walled appearance of the bladder which may be due to underdistention. There  mild cystitis is not entirely excluded. Electronically Signed   By: Tollie Eth M.D.   On: 09/15/2016 17:04    Anti-infectives: Anti-infectives    Start     Dose/Rate Route Frequency Ordered Stop   09/14/16 1400  metroNIDAZOLE (FLAGYL) tablet 500 mg     500 mg Oral Every 8 hours 09/14/16 1153     09/14/16 1300  ciprofloxacin (CIPRO) tablet 500 mg     500 mg Oral 2 times daily 09/14/16 1153     09/10/16 1200  ciprofloxacin (CIPRO) IVPB 400 mg  Status:  Discontinued     400 mg 200 mL/hr over 60 Minutes Intravenous Every 12 hours 09/10/16 0023 09/14/16 1153   09/10/16 0030  metroNIDAZOLE (FLAGYL) IVPB 500 mg  Status:  Discontinued     500 mg 100 mL/hr over 60 Minutes Intravenous Every 8 hours 09/10/16 0017 09/14/16 1153   09/10/16 0030  ciprofloxacin (CIPRO) IVPB 400 mg  Status:  Discontinued     400 mg 200 mL/hr over 60 Minutes Intravenous  Once 09/10/16 0023 09/10/16 0050   09/10/16 0000  piperacillin-tazobactam (ZOSYN) IVPB 3.375 g  Status:  Discontinued     3.375 g 12.5 mL/hr over 240 Minutes Intravenous  Once 09/09/16 2355 09/10/16 0016       Assessment/Plan  Sigmoid diverticulitis with abscess - CT: sigmoid diverticulitis with localized perforation and a large abscess in the deep pelvis between the rectum and bladder - s/p IR drain placement 1/13, culture showed E. coli - IR: repeat CT and drain study 1/18: showed fluid collection has resolved. Fistula noted to colon. Recommend continue drainage and flushing drain and follow-up CT in 1-2 weeks with delayed images in the pelvis to evaluate drain relationship with left ureter. additional drain injection at same time as CT.   - Renal US yesterday to rule out left hydronephrosis. Was negative for hydronephrosis. Slight wall thickening of bladder.  - WBC WNL; afebrile - blood cultures no growth to date  Alcohol use - CIWA Asthma - PRN albuterol nebs  ID - cipro & Flagyl 1/13>>  FEN - IVF, soft diet VTE - SCDs &  lovenox  Plan - IR drain study yesterday with recommendations to leave drain in and repeat CT in 1-2 weeks. Abscess smaller in size. It appears that the pt is improving with conservative treatment. No indication for surgery at this time. Pt can f/u in our office with Dr. Andrey Campanile in 3-4 weeks. Pt is clear for discharge from a surgical standpoint. Recommend one more week of PO antibiotics for at least one more week.    LOS: 6 days     Jerre Simon , [facility] Surgery 09/16/2016, 7:52 AM Pager: [phone] Consults: [phone] Mon-Fri 7:00 am-4:30 pm Sat-Sun 7:00 am-11:30 am

## 2016-09-16 NOTE — Discharge Summary (Signed)
Physician Discharge Summary  Daniel Berger ZOX:[id number] DOB: [birth year] DOA: 09/09/2016  PCP: No PCP Per Patient  Admit date: 09/09/2016 Discharge date: 09/16/2016  Admitted From: Home Disposition:  Home  Recommendations for Outpatient Follow-up:  1. Follow up with PCP in 2-3 weeks 2. Follow up with Dr. Andrey Campanile in 3-4 weeks 3. Follow up with outpatient IR drain clinic in 2 weeks 4. Flush drain daily with 5cc sterile saline at home  Discharge Condition:Stable CODE STATUS:Full Diet recommendation: Regular   Brief/Interim Summary: 35/M with history of asthma/alcohol abuse admitted with abdominal pain and diarrhea for 1-2 weeks, found to have acute diverticulitis with perf and deep pelvic abscess S/p Perc drain per IR 1/13   1. 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 Surgery recommends additional one week of PO abx on discharge -tolerating diet  2. Alcohol abuse -Counseled, continue thiamine, monitor on CIWAprotocol, no evidence of withdrawal yet  Discharge Diagnoses:  Principal Problem:   Abscess of male pelvis (HCC) Active Problems:   Asthma   Alcohol use   Diverticulitis of large intestine with abscess without bleeding    Discharge Instructions   Allergies as of 09/16/2016   No Known Allergies     Medication List    TAKE these medications    albuterol 108 (90 Base) MCG/ACT inhaler Commonly known as:  PROVENTIL HFA;VENTOLIN HFA Inhale 2 puffs into the lungs every 6 (six) hours as needed for wheezing or shortness of breath.   ciprofloxacin 500 MG tablet Commonly known as:  CIPRO Take 1 tablet (500 mg total) by mouth 2 (two) times daily.   metroNIDAZOLE 500 MG tablet Commonly known as:  FLAGYL Take 1 tablet (500 mg total) by mouth every 8 (eight) hours.   MILK OF MAGNESIA PO Take 60 mLs by mouth daily as needed (diarrhea).      Follow-up Information    Atilano Ina, MD. Schedule an appointment as soon as possible for a visit in 3 week(s).   Specialty:  General Surgery Contact information: [address] [place] [address] [phone]        Simonne Come, MD Follow up in 2 week(s).   Specialty:  Interventional Radiology Why:  follow up with Dr Grace Isaac for abscess drain; pt will hear from scheduler for time and date Contact information: [address] [place] [address] [phone]        Follow up with your PCP in 2-3 weeks. Schedule an appointment as soon as possible for a visit in 2 week(s).          No Known Allergies  Consultations:  IR  General Surgery  Procedures/Studies: Ct Pelvis W Contrast  Result Date: 09/15/2016 CLINICAL DATA:  Perirectal abscess.  Pelvic drain. EXAM: CT PELVIS WITH CONTRAST TECHNIQUE: Multidetector CT imaging of the pelvis was performed using the standard protocol following the bolus administration of intravenous contrast. CONTRAST:  1 ISOVUE-300 IOPAMIDOL (ISOVUE-300)  INJECTION 61% COMPARISON:  09/09/2016 FINDINGS: Urinary Tract: Excreted contrast noted in the ureters, although there is poor filling of the left distal ureter. Inflammatory phlegmon and thickening along the left lateral and posterior bladder margin as on image 63/2. The pigtail catheter does pass in the vicinity of the left distal ureter, but I do not see an obvious leak of urine  to suggest that the ureter is injured. Bowel: Sigmoid diverticulosis with inflammatory stranding in the vicinity suggesting active diverticulitis. On image 114/4, there is a small track containing some gas abutment to the left of the cul well of the pigtail catheter and extending up towards the proximal sigmoid colon. The possibility of a fistulous connection of the sigmoid colon with the abscess cavity is raised, correlate with any persistent drainage. Vascular/Lymphatic: Reactive inguinal and external iliac lymph nodes. Reproductive: Stranding along the left seminal vesicle with due to regional phlegmon. Other: Pigtail catheter coiled anterior to the rectum in just above the seminal vesicles in the vicinity of the prior large abscess. The abscess has been successfully drained without well-defined residual fluid density although there is some residual inflammatory findings. There stranding along the course of the catheter to the left of the bladder which could be from inflammation or hematoma. Presacral edema similar to prior. Stranding along the perirectal space bilaterally. Musculoskeletal: Unremarkable IMPRESSION: 1. Significantly improved appearance with successful drainage of the pelvic abscess. There is some likely hematoma tracking along the pigtail catheter to the left of the urinary bladder, less likely infectious/inflammatory phlegmon. The possibility of a small connection between the sigmoid colon and the drained abscess cavity is raised given several locules of gas in a track extending towards the pigtail catheter from the proximal sigmoid colon, see image 114/4. 2. The left distal ureter does not fill with contrast in the region of inflammation and possible hematoma. However, I do not see typical findings for a urinoma. Accordingly I doubt that the ureter it is injured. 3. Extensive sigmoid diverticulosis with reduced active diverticulitis. Reactive adenopathy in the pelvis. Electronically Signed   By:  Gaylyn Rong M.D.   On: 09/15/2016 13:25   Ct Abdomen Pelvis W Contrast  Result Date: 09/09/2016 CLINICAL DATA:  Abdominal pain for 1 week. EXAM: CT ABDOMEN AND PELVIS WITH CONTRAST TECHNIQUE: Multidetector CT imaging of the abdomen and pelvis was performed using the standard protocol following bolus administration of intravenous contrast. CONTRAST:  ISOVUE-300 IOPAMIDOL (ISOVUE-300) INJECTION 61% COMPARISON:  None. FINDINGS: Lower chest: Lung bases are clear. Hepatobiliary: No focal liver abnormality is seen. No gallstones, gallbladder wall thickening, or biliary dilatation. Pancreas: Unremarkable. No pancreatic ductal dilatation or surrounding inflammatory changes. Spleen: Normal in size without focal abnormality. Adrenals/Urinary Tract: Adrenal glands are unremarkable. Kidneys are normal, without renal calculi, focal lesion, or hydronephrosis. Bladder is unremarkable. Stomach/Bowel: Stomach, small bowel, and colon are not abnormally distended. Diverticulosis of the sigmoid colon. Thickened wall of the sigmoid colon and rectum. Circumscribed fluid collection with thick wall and peripheral enhancement demonstrated in the space between the rectum and bladder. There is a tiny gas bubble. This is consistent with an abscess. This probably represents changes related to diverticulitis with localized perforation leading to a loculated abscess. The abscess cavity measures about 5.1 x 7.1 x 7.3 cm. The appendix is segmentally visualized and appears normal. Vascular/Lymphatic: No significant vascular findings are present. No enlarged abdominal or pelvic lymph nodes. Reproductive: Prostate is unremarkable. Other: No free air or free fluid in the abdomen. Abdominal wall musculature appears  intact. Musculoskeletal: No acute or significant osseous findings. IMPRESSION: There is a large abscess in the deep pelvis between the rectum and bladder. Diverticulosis of the sigmoid colon with thick wall of the rectum and  sigmoid colon. Likely this represents diverticulitis with localized perforation leading to abscess formation. Electronically Signed   By: Burman Nieves M.D.   On: 09/09/2016 22:44   Ir Sinus/fist Tube Chk-non Gi  Result Date: 09/15/2016 INDICATION: 36 year old with a diverticular abscess and underwent percutaneous drain placement on 09/10/2016. Recent CT demonstrated resolution of the pelvic abscess. Drain needs to be evaluated for possible bowel fistula. EXAM: SINUS TRACT INJECTION / FISTULOGRAM COMPARISON:  None. MEDICATIONS: None Fluoroscopy:  1 minutes and 49 seconds, 16 mGy ANESTHESIA/SEDATION: None COMPLICATIONS: None immediate. TECHNIQUE: Patient was placed supine on the interventional table. The drain was injected with approximately 5 ml of contrast. Initially, a bowel fistula was not identified and was planning to remove the drain. Planned to remove the drain over a wire because there was some blood products around the drain track on the recent CT. The catheter and surrounding skin were prepped and draped in a sterile fashion. Maximal barrier sterile technique was utilized including caps, mask, sterile gowns, sterile gloves, sterile drape, hand hygiene and skin antiseptic. The drain was injected a second time under fluoroscopy with 10 ml of contrast. 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 Therefore, the drain was not removed. Skin was anesthetized with 1% lidocaine and a new suture was placed. Catheter was attached to gravity bag. PROCEDURE: The drain was injected with contrast. All the contrast was aspirated at the procedure. New suture was placed. FINDINGS: Small residual abscess cavity. Initially, a bowel fistula was not identified. Second injection demonstrated filling of bowel adjacent to the abscess cavity. Findings compatible with a colonic fistula. No abnormal contrast identified in the renal collecting systems or ureters. There is contrast within the urinary bladder from recent CT.  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 Therefore, the drain was not removed. Recent CT demonstrated that the drainage catheter is near the left ureter but no evidence for a ureter injury or urine leak. However, a renal ultrasound has been ordered to exclude any hydronephrosis. Recommend keeping the drain in place for 1-2 weeks. Plan for CT of the abdomen and pelvis with IV contrast with delayed images through the pelvis in 1-2 weeks. Following CT, patient could have another drain injection. Plan to keep the drain until the fistula has resolved. Electronically Signed   By: Richarda Overlie M.D.   On: 09/15/2016 15:31   US Renal  Result Date: 09/15/2016 CLINICAL DATA:  Pelvic abscess with percutaneous pelvic drainage catheter. EXAM: RENAL / URINARY TRACT ULTRASOUND COMPLETE COMPARISON:  09/15/2016 pelvic CT, CT abdomen pelvis from 09/11/2015 FINDINGS: Right Kidney: Length: 11.2 cm. Echogenicity within normal limits. No mass or hydronephrosis visualized. No genitourinary calculus. Left Kidney: Length: 11.3 cm. Echogenicity within normal limits. No mass or hydronephrosis visualized. No genitourinary calculus. Bladder: Bladder is slightly thick-walled in appearance but this may be due to underdistention. Mild inflammation possibly representing cystitis is not entirely excluded. IMPRESSION: No obstructive uropathy. Slightly thick-walled appearance of the bladder which may be due to underdistention. There mild cystitis is not entirely excluded. Electronically Signed   By: Tollie Eth M.D.   On: 09/15/2016 17:04   Ct Image Guided Drainage By Percutaneous Catheter  Result Date: 09/10/2016 INDICATION: Diverticular abscess. Please perform CT-guided percutaneous drainage catheter placement for infection source control. EXAM: CT IMAGE  GUIDED DRAINAGE BY PERCUTANEOUS CATHETER COMPARISON:  CT abdomen and pelvis - 07/10/2017 MEDICATIONS: The patient is currently admitted to the hospital and receiving intravenous  antibiotics. The antibiotics were administered within an appropriate time frame prior to the initiation of the procedure. ANESTHESIA/SEDATION: Moderate (conscious) sedation was employed during this procedure. A total of Versed 3.5 mg and Fentanyl 175 mcg was administered intravenously. Moderate Sedation Time: 35 minutes. The patient's level of consciousness and vital signs were monitored continuously by radiology nursing throughout the procedure under my direct supervision. CONTRAST:  None COMPLICATIONS: None immediate. PROCEDURE: Informed written consent was obtained from the patient after a discussion of the risks, benefits and alternatives to treatment. The patient was placed supine on the CT gantry and a pre procedural CT was performed re-demonstrating the known abscess/fluid collection within the lower pelvis with dominant component measuring approximately 7.4 x 5.1 cm (image 32, series 8). The procedure was planned. A timeout was performed prior to the initiation of the procedure. The skin overlying the anterior aspect of the left lower abdomen/pelvis was prepped and draped in the usual sterile fashion. The overlying soft tissues were anesthetized with 1% lidocaine with epinephrine. Appropriate trajectory was planned with the use of a 22 gauge spinal needle. An 18 gauge trocar needle was advanced into the abscess/fluid collection and a short Amplatz super stiff wire was coiled within the collection. Appropriate positioning was confirmed with a limited CT scan. The tract was serially dilated however initially there was difficulty advancing the percutaneous drainage catheter into the well-formed pelvic abscess. Ultimately, initial attempt at drainage catheter placement proved unsuccessful. Again, an 18 gauge trocar needle was advanced into the abscess/fluid collection and a short Amplatz super stiff wire was coiled within the collection. Appropriate positioning was confirmed with a limited CT scan. The tract was  serially dilated allowing placement of a 10 [place] all-purpose drainage catheter. Appropriate positioning was confirmed with a limited postprocedural CT scan. Approximately 300 cc of purulent, foul smelling fluid fluid was aspirated. The tube was connected to a drainage bag and sutured in place. A dressing was placed. The patient tolerated the procedure well without immediate post procedural complication. IMPRESSION: Successful CT guided placement of a 10 French all purpose drain catheter into the pelvic diverticular abscess via anterior left lower abdominal/pelvic approach with aspiration of 300 cc of purulent, foul smelling fluid. Samples were sent to the laboratory as requested by the ordering clinical team. Electronically Signed   By: Simonne Come M.D.   On: 09/10/2016 14:59    Subjective: No complaints  Discharge Exam: Vitals:   09/15/16 2100 09/16/16 0500  BP: 109/75 109/66  Pulse: 78 64  Resp: 20 19  Temp: 98.4 F (36.9 C) 98.2 F (36.8 C)   Vitals:   09/15/16 0455 09/15/16 1712 09/15/16 2100 09/16/16 0500  BP: 118/88 115/78 109/75 109/66  Pulse: 76 63 78 64  Resp:  18 20 19   Temp: 98.6 F (37 C) 98.5 F (36.9 C) 98.4 F (36.9 C) 98.2 F (36.8 C)  TempSrc: Oral Oral Oral Oral  SpO2: 100% 99% 98% 99%  Weight:      Height:        General: Pt is alert, awake, not in acute distress Cardiovascular: RRR, S1/S2 +, no rubs, no gallops Respiratory: CTA bilaterally, no wheezing, no rhonchi Abdominal: Soft, NT, ND, bowel sounds + Extremities: no edema, no cyanosis   The results of significant diagnostics from this hospitalization (including imaging, microbiology, ancillary and laboratory) are listed  below for reference.     Microbiology: Recent Results (from the past 240 hour(s))  Culture, blood (x 2)     Status: None   Collection Time: 09/10/16  2:18 AM  Result Value Ref Range Status   Specimen Description BLOOD LEFT ARM  Final   Special Requests BOTTLES DRAWN AEROBIC AND  ANAEROBIC  Final   Culture NO GROWTH 5 DAYS  Final   Report Status 09/15/2016 FINAL  Final  Culture, blood (x 2)     Status: None   Collection Time: 09/10/16  2:32 AM  Result Value Ref Range Status   Specimen Description BLOOD LEFT HAND  Final   Special Requests IN PEDIATRIC BOTTLE  Final   Culture NO GROWTH 5 DAYS  Final   Report Status 09/15/2016 FINAL  Final  C difficile quick scan w PCR reflex     Status: None   Collection Time: 09/10/16  7:34 AM  Result Value Ref Range Status   C Diff antigen NEGATIVE NEGATIVE Final   C Diff toxin NEGATIVE NEGATIVE Final   C Diff interpretation No C. difficile detected.  Final  Aerobic/Anaerobic Culture (surgical/deep wound)     Status: None   Collection Time: 09/10/16  2:44 PM  Result Value Ref Range Status   Specimen Description ABSCESS DRAINAGE  Final   Special Requests DIVERTICULAR ABSCESS  Final   Gram Stain   Final    ABUNDANT WBC PRESENT, PREDOMINANTLY PMN ABUNDANT GRAM POSITIVE COCCI IN CHAINS ABUNDANT GRAM POSITIVE COCCI IN PAIRS ABUNDANT GRAM POSITIVE COCCI IN CLUSTERS ABUNDANT GRAM NEGATIVE RODS    Culture   Final    MODERATE ESCHERICHIA COLI MIXED ANAEROBIC FLORA PRESENT.  CALL LAB IF FURTHER IID REQUIRED.    Report Status 09/14/2016 FINAL  Final   Organism ID, Bacteria ESCHERICHIA COLI  Final      Susceptibility   Escherichia coli - MIC*    AMPICILLIN >=32 RESISTANT Resistant     CEFAZOLIN <=4 SENSITIVE Sensitive     CEFEPIME <=1 SENSITIVE Sensitive     CEFTAZIDIME <=1 SENSITIVE Sensitive     CEFTRIAXONE <=1 SENSITIVE Sensitive     CIPROFLOXACIN <=0.25 SENSITIVE Sensitive     GENTAMICIN <=1 SENSITIVE Sensitive     IMIPENEM <=0.25 SENSITIVE Sensitive     TRIMETH/SULFA <=20 SENSITIVE Sensitive     AMPICILLIN/SULBACTAM 8 SENSITIVE Sensitive     PIP/TAZO <=4 SENSITIVE Sensitive     Extended ESBL NEGATIVE Sensitive     * MODERATE ESCHERICHIA COLI     Labs: BNP (last 3 results) No results for input(s): BNP in  the last 8760 hours. Basic Metabolic Panel:  Recent Labs Lab 09/10/16 0451 09/11/16 0315 09/12/16 0545 09/13/16 0424 09/14/16 0453  NA 135 135 136 136 139  K 3.9 3.8 3.9 4.1 4.3  CL 102 102 100* 105 107  CO2 24 23 22 23 22   GLUCOSE 93 82 75 88 87  BUN 6 8 7  <5* <5*  CREATININE 0.84 1.09 0.92 0.81 0.80  CALCIUM 8.8* 8.6* 8.9 8.6* 8.9   Liver Function Tests: No results for input(s): AST, ALT, ALKPHOS, BILITOT, PROT, ALBUMIN in the last 168 hours. No results for input(s): LIPASE, AMYLASE in the last 168 hours. No results for input(s): AMMONIA in the last 168 hours. CBC:  Recent Labs Lab 09/11/16 0315 09/12/16 0545 09/13/16 0424 09/14/16 0453 09/15/16 0531  WBC 11.1* 8.8 7.2 6.6 5.5  HGB 11.9* 12.0* 11.5* 12.0* 12.8*  HCT 35.7* 36.0* 33.9* 37.0* 38.9*  MCV 90.6 90.0 89.9 90.5 91.7  PLT 411* 410* 407* 438* 474*   Cardiac Enzymes: No results for input(s): CKTOTAL, CKMB, CKMBINDEX, TROPONINI in the last 168 hours. BNP: Invalid input(s): POCBNP CBG:  Recent Labs Lab 09/12/16 0801 09/13/16 0755 09/14/16 0744 09/15/16 0820 09/16/16 0746  GLUCAP 88 97 101* 85 113*   D-Dimer No results for input(s): DDIMER in the last 72 hours. Hgb A1c No results for input(s): HGBA1C in the last 72 hours. Lipid Profile No results for input(s): CHOL, HDL, LDLCALC, TRIG, CHOLHDL, LDLDIRECT in the last 72 hours. Thyroid function studies No results for input(s): TSH, T4TOTAL, T3FREE, THYROIDAB in the last 72 hours.  Invalid input(s): FREET3 Anemia work up No results for input(s): VITAMINB12, FOLATE, FERRITIN, TIBC, IRON, RETICCTPCT in the last 72 hours. Urinalysis    Component Value Date/Time   COLORURINE YELLOW 09/09/2016 1301   APPEARANCEUR CLEAR 09/09/2016 1301   LABSPEC 1.018 09/09/2016 1301   PHURINE 5.0 09/09/2016 1301   GLUCOSEU NEGATIVE 09/09/2016 1301   HGBUR NEGATIVE 09/09/2016 1301   BILIRUBINUR NEGATIVE 09/09/2016 1301   KETONESUR NEGATIVE 09/09/2016 1301    PROTEINUR NEGATIVE 09/09/2016 1301   NITRITE NEGATIVE 09/09/2016 1301   LEUKOCYTESUR NEGATIVE 09/09/2016 1301   Sepsis Labs Invalid input(s): PROCALCITONIN,  WBC,  LACTICIDVEN Microbiology Recent Results (from the past 240 hour(s))  Culture, blood (x 2)     Status: None   Collection Time: 09/10/16  2:18 AM  Result Value Ref Range Status   Specimen Description BLOOD LEFT ARM  Final   Special Requests BOTTLES DRAWN AEROBIC AND ANAEROBIC 5ML  Final   Culture NO GROWTH 5 DAYS  Final   Report Status 09/15/2016 FINAL  Final  Culture, blood (x 2)     Status: None   Collection Time: 09/10/16  2:32 AM  Result Value Ref Range Status   Specimen Description BLOOD LEFT HAND  Final   Special Requests IN PEDIATRIC BOTTLE 3ML  Final   Culture NO GROWTH 5 DAYS  Final   Report Status 09/15/2016 FINAL  Final  C difficile quick scan w PCR reflex     Status: None   Collection Time: 09/10/16  7:34 AM  Result Value Ref Range Status   C Diff antigen NEGATIVE NEGATIVE Final   C Diff toxin NEGATIVE NEGATIVE Final   C Diff interpretation No C. difficile detected.  Final  Aerobic/Anaerobic Culture (surgical/deep wound)     Status: None   Collection Time: 09/10/16  2:44 PM  Result Value Ref Range Status   Specimen Description ABSCESS DRAINAGE  Final   Special Requests DIVERTICULAR ABSCESS  Final   Gram Stain   Final    ABUNDANT WBC PRESENT, PREDOMINANTLY PMN ABUNDANT GRAM POSITIVE COCCI IN CHAINS ABUNDANT GRAM POSITIVE COCCI IN PAIRS ABUNDANT GRAM POSITIVE COCCI IN CLUSTERS ABUNDANT GRAM NEGATIVE RODS    Culture   Final    MODERATE ESCHERICHIA COLI MIXED ANAEROBIC FLORA PRESENT.  CALL LAB IF FURTHER IID REQUIRED.    Report Status 09/14/2016 FINAL  Final   Organism ID, Bacteria ESCHERICHIA COLI  Final      Susceptibility   Escherichia coli - MIC*    AMPICILLIN >=32 RESISTANT Resistant     CEFAZOLIN <=4 SENSITIVE Sensitive     CEFEPIME <=1 SENSITIVE Sensitive     CEFTAZIDIME <=1 SENSITIVE  Sensitive     CEFTRIAXONE <=1 SENSITIVE Sensitive     CIPROFLOXACIN <=0.25 SENSITIVE Sensitive     GENTAMICIN <=1 SENSITIVE Sensitive  IMIPENEM <=0.25 SENSITIVE Sensitive     TRIMETH/SULFA <=20 SENSITIVE Sensitive     AMPICILLIN/SULBACTAM 8 SENSITIVE Sensitive     PIP/TAZO <=4 SENSITIVE Sensitive     Extended ESBL NEGATIVE Sensitive     * MODERATE ESCHERICHIA COLI     SIGNED:   Jerald Kief, MD  [facility] 09/16/2016, 1:36 PM  If 7PM-7AM, please contact night-coverage [email or web address] Password [email or web address]

## 2016-09-16 NOTE — Care Management Note (Addendum)
Case Management Note  Patient Details  Name: Daniel Berger MRN: [id number] Date of Birth: 09/12/1980  Subjective/Objective:    Acute diverticulitis with perforation and large abscess in deep pelvis                Action/Plan: Discharge Planning: AVS reviewed: please see previous NCM notes  [facility] to pt [facility]. Pt's medications will be Cipro $4 and Flagyl $9 [facility]. [facility] RN will demonstrate to pt how to flush drain prior to dc and provide 1-2 day supply [facility] flushes [facility]. Start of care is 24-48 hours post dc. Pt works and requesting note for work.    Expected Discharge Date:  09/16/16               Expected Discharge Plan:  Home w Home Health Services  In-House Referral:  NA  Discharge planning Services  CM Consult  Post Acute Care Choice:  Home Health Choice offered to:  Patient  DME Arranged:  N/A DME Agency:  NA  HH Arranged:  RN HH Agency:  [facility]  Status of Service:  Completed, signed off  If discussed at Long Length of Stay Meetings, dates discussed:    Additional Comments:  [name], [name], RN 09/16/2016, 2:34 PM

## 2016-09-16 NOTE — Progress Notes (Signed)
Referring Physician(s): Dr Mitchel Honour  Supervising Physician: Jolaine Click  Patient Status:  [facility] - In-pt  Chief Complaint:  Pelvic abscess drain placed 1/13  Subjective:  Better daily Tolerating more and more diet Op blood tinged thin fluid 15- 20 cc daily CT and drain injection 1/18: IR SINUS/FIST TUBE CHK-NON GI [ZOX096 (Custom)]       The pelvic fluid collection has resolved by CT.  Initial drain injection showed no fistula connection to bowel.  Was planning to remove the drain over a wire because there is some blood along the drain tract and close proximity to the distal left ureter.  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  As a result, left drain in place.  Recommend continue drainage and flushing drain with no more than 5 ml/day.  Recommend a follow-up CT of abdomen and pelvis in 1-2 weeks with delayed images in the pelvis to evaluate drain relationship with left ureter.  Patient could have another drain injection at same time as CT.  Will get a renal US today because left ureter is slightly larger than right ureter on today's exam.  Want to rule out left hydronephrosis, although this is probably unlikely based on the fluoroscopic images from today.       Allergies: Patient has no known allergies.  Medications: Prior to Admission medications   Medication Sig Start Date End Date Taking? Authorizing Provider  albuterol (PROVENTIL HFA;VENTOLIN HFA) 108 (90 Base) MCG/ACT inhaler Inhale 2 puffs into the lungs every 6 (six) hours as needed for wheezing or shortness of breath.   Yes Historical Provider, MD  Magnesium Hydroxide (MILK OF MAGNESIA PO) Take 60 mLs by mouth daily as needed (diarrhea).   Yes Historical Provider, MD     Vital Signs: BP 109/66 (BP Location: Right Arm)   Pulse 64   Temp 98.2 F (36.8 C) (Oral)   Resp 19   Ht 5\' 7"  (1.702 m)   Wt 130 lb 8.2 oz (59.2 kg)   SpO2 99%   BMI 20.44  kg/m   Physical Exam  Constitutional: He is oriented to person, place, and time.  Abdominal: Soft. Bowel sounds are normal.  Musculoskeletal: Normal range of motion.  Neurological: He is alert and oriented to person, place, and time.  Skin: Skin is warm and dry.  Site of drain is clean and dry NT no bleeding Output minimal Blood tinged thin fluid + Ecoli Drain injection 1/18: + fistula    Nursing note and vitals reviewed.   Imaging: Ct Pelvis W Contrast  Result Date: 09/15/2016 CLINICAL DATA:  Perirectal abscess.  Pelvic drain. EXAM: CT PELVIS WITH CONTRAST TECHNIQUE: Multidetector CT imaging of the pelvis was performed using the standard protocol following the bolus administration of intravenous contrast. CONTRAST:  1 ISOVUE-300 IOPAMIDOL (ISOVUE-300) INJECTION 61% COMPARISON:  09/09/2016 FINDINGS: Urinary Tract: Excreted contrast noted in the ureters, although there is poor filling of the left distal ureter. Inflammatory phlegmon and thickening along the left lateral and posterior bladder margin as on image 63/2. The pigtail catheter does pass in the vicinity of the left distal ureter, but I do not see an obvious leak of urine to suggest that the ureter is injured. Bowel: Sigmoid diverticulosis with inflammatory stranding in the vicinity suggesting active diverticulitis. On image 114/4, there is a small track containing some gas abutment to the left of the cul well of the pigtail catheter and extending up  towards the proximal sigmoid colon. The possibility of a fistulous connection of the sigmoid colon with the abscess cavity is raised, correlate with any persistent drainage. Vascular/Lymphatic: Reactive inguinal and external iliac lymph nodes. Reproductive: Stranding along the left seminal vesicle with due to regional phlegmon. Other: Pigtail catheter coiled anterior to the rectum in just above the seminal vesicles in the vicinity of the prior large abscess. The abscess has been  successfully drained without well-defined residual fluid density although there is some residual inflammatory findings. There stranding along the course of the catheter to the left of the bladder which could be from inflammation or hematoma. Presacral edema similar to prior. Stranding along the perirectal space bilaterally. Musculoskeletal: Unremarkable IMPRESSION: 1. Significantly improved appearance with successful drainage of the pelvic abscess. There is some likely hematoma tracking along the pigtail catheter to the left of the urinary bladder, less likely infectious/inflammatory phlegmon. The possibility of a small connection between the sigmoid colon and the drained abscess cavity is raised given several locules of gas in a track extending towards the pigtail catheter from the proximal sigmoid colon, see image 114/4. 2. The left distal ureter does not fill with contrast in the region of inflammation and possible hematoma. However, I do not see typical findings for a urinoma. Accordingly I doubt that the ureter it is injured. 3. Extensive sigmoid diverticulosis with reduced active diverticulitis. Reactive adenopathy in the pelvis. Electronically Signed   By: Gaylyn Rong M.D.   On: 09/15/2016 13:25   Ir Sinus/fist Tube Chk-non Gi  Result Date: 09/15/2016 INDICATION: 36 year old with a diverticular abscess and underwent percutaneous drain placement on 09/10/2016. Recent CT demonstrated resolution of the pelvic abscess. Drain needs to be evaluated for possible bowel fistula. EXAM: SINUS TRACT INJECTION / FISTULOGRAM COMPARISON:  None. MEDICATIONS: None Fluoroscopy:  1 minutes and 49 seconds, 16 mGy ANESTHESIA/SEDATION: None COMPLICATIONS: None immediate. TECHNIQUE: Patient was placed supine on the interventional table. The drain was injected with approximately 5 ml of contrast. Initially, a bowel fistula was not identified and was planning to remove the drain. Planned to remove the drain over a wire  because there was some blood products around the drain track on the recent CT. The catheter and surrounding skin were prepped and draped in a sterile fashion. Maximal barrier sterile technique was utilized including caps, mask, sterile gowns, sterile gloves, sterile drape, hand hygiene and skin antiseptic. The drain was injected a second time under fluoroscopy with 10 ml of contrast. 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 Therefore, the drain was not removed. Skin was anesthetized with 1% lidocaine and a new suture was placed. Catheter was attached to gravity bag. PROCEDURE: The drain was injected with contrast. All the contrast was aspirated at the procedure. New suture was placed. FINDINGS: Small residual abscess cavity. Initially, a bowel fistula was not identified. Second injection demonstrated filling of bowel adjacent to the abscess cavity. Findings compatible with a colonic fistula. No abnormal contrast identified in the renal collecting systems or ureters. There is contrast within the urinary bladder from recent CT. 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 Therefore, the drain was not removed. Recent CT demonstrated that the drainage catheter is near the left ureter but no evidence for a ureter injury or urine leak. However, a renal ultrasound has been ordered to exclude any hydronephrosis. Recommend keeping the drain in place for 1-2 weeks. Plan for CT of the abdomen and pelvis with IV contrast with delayed images through the pelvis in 1-2  weeks. Following CT, patient could have another drain injection. Plan to keep the drain until the fistula has resolved. Electronically Signed   By: Richarda OverlieAdam  Henn M.D.   On: 09/15/2016 15:31   [place] Renal  Result Date: 09/15/2016 CLINICAL DATA:  Pelvic abscess with percutaneous pelvic drainage catheter. EXAM: RENAL / URINARY TRACT ULTRASOUND COMPLETE COMPARISON:  09/15/2016 pelvic CT, CT abdomen pelvis from 09/11/2015 FINDINGS: Right Kidney: Length: 11.2  cm. Echogenicity within normal limits. No mass or hydronephrosis visualized. No genitourinary calculus. Left Kidney: Length: 11.3 cm. Echogenicity within normal limits. No mass or hydronephrosis visualized. No genitourinary calculus. Bladder: Bladder is slightly thick-walled in appearance but this may be due to underdistention. Mild inflammation possibly representing cystitis is not entirely excluded. IMPRESSION: No obstructive uropathy. Slightly thick-walled appearance of the bladder which may be due to underdistention. There mild cystitis is not entirely excluded. Electronically Signed   By: Tollie Ethavid  Kwon M.D.   On: 09/15/2016 17:04    Labs:  CBC:  Recent Labs  09/12/16 0545 09/13/16 0424 09/14/16 0453 09/15/16 0531  WBC 8.8 7.2 6.6 5.5  HGB 12.0* 11.5* 12.0* 12.8*  HCT 36.0* 33.9* 37.0* 38.9*  PLT 410* 407* 438* 474*    COAGS:  Recent Labs  09/10/16 0210  INR 1.30  APTT 37*    BMP:  Recent Labs  09/11/16 0315 09/12/16 0545 09/13/16 0424 09/14/16 0453  NA 135 136 136 139  K 3.8 3.9 4.1 4.3  CL 102 100* 105 107  CO2 23 22 23 22   GLUCOSE 82 75 88 87  BUN 8 7 <5* <5*  CALCIUM 8.6* 8.9 8.6* 8.9  CREATININE 1.09 0.92 0.81 0.80  GFRNONAA >60 >60 >60 >60  GFRAA >60 >60 >60 >60    LIVER FUNCTION TESTS:  Recent Labs  09/09/16 1317  BILITOT 0.4  AST 14*  ALT 22  ALKPHOS 57  PROT 7.6  ALBUMIN 3.0*    Assessment and Plan:  Pelvic abscess drain intact OP minimal + fistula per drain injection Drain must remain for now I will order follow up in OP IR Drain clinic for 2 weeks Will need to flush drain daily with 5 cc sterile saline at home Record output Pt aware and agreeable Plan per [facility] and [facility]   Electronically Signed: Breven Guidroz A 09/16/2016, 11:07 AM   I spent a total of 15 Minutes at the the patient's bedside AND on the patient's hospital floor or unit, greater than 50% of which was counseling/coordinating care for abscess drain

## 2016-09-19 ENCOUNTER — Other Ambulatory Visit: Payer: Self-pay | Admitting: General Surgery

## 2016-09-19 DIAGNOSIS — K651 Peritoneal abscess: Secondary | ICD-10-CM

## 2016-09-22 ENCOUNTER — Encounter: Payer: Self-pay | Admitting: Radiology

## 2016-09-22 ENCOUNTER — Ambulatory Visit
Admission: RE | Admit: 2016-09-22 | Discharge: 2016-09-22 | Disposition: A | Discharge status: Home / Self Care | Payer: Self-pay | Source: Ambulatory Visit | Attending: General Surgery | Admitting: General Surgery

## 2016-09-22 ENCOUNTER — Ambulatory Visit
Admission: RE | Admit: 2016-09-22 | Discharge: 2016-09-22 | Disposition: A | Discharge status: Home / Self Care | Payer: Self-pay | Source: Ambulatory Visit | Attending: Radiology | Admitting: Radiology

## 2016-09-22 DIAGNOSIS — K651 Peritoneal abscess: Secondary | ICD-10-CM

## 2016-09-22 HISTORY — PX: IR GENERIC HISTORICAL: IMG1180011

## 2016-09-22 MED ORDER — IOPAMIDOL (ISOVUE-300) INJECTION 61%
100.0000 mL | Freq: Once | INTRAVENOUS | Status: AC | PRN
  Administered 2016-09-22: 100 mL via INTRAVENOUS

## 2016-09-22 NOTE — Progress Notes (Signed)
Referring Physician(s): Wilson,E  Chief Complaint: The patient is seen in follow up today s/p percutaneous drainage of left lower quadrant diverticular abscess on 09/10/16  History of present illness: [name] is a 36 year old male with history of diverticulitis and left lower quadrant abscess formation, status post drainage on 09/10/16. 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 Drain fluid cultures revealed Escherichia coli. The patient has been on Cipro and Flagyl since discharge. He currently reports no fevers, chills, abdominal pain, nausea, vomiting. He is currently flushing the catheter 3 times daily. Output of drain ranges from 5-10 mL per day. He has not followed up with [facility] since discharge. He presents today for follow-up CT and possible drain injection to reassess fistula.   Past Medical History:  Diagnosis Date  . Asthma   . Bronchitis     Past Surgical History:  Procedure Laterality Date  . IR GENERIC HISTORICAL  09/15/2016   IR SINUS/FIST TUBE CHK-NON GI 09/15/2016 Richarda OverlieAdam Henn, MD [facility]-INTERV RAD  . SURGERY OF LIP      Allergies: Patient has no known allergies.  Medications: Prior to Admission medications   Medication Sig Start Date End Date Taking? Authorizing Provider  albuterol (PROVENTIL HFA;VENTOLIN HFA) 108 (90 Base) MCG/ACT inhaler Inhale 2 puffs into the lungs every 6 (six) hours as needed for wheezing or shortness of breath.    Historical Provider, MD  ciprofloxacin (CIPRO) 500 MG tablet Take 1 tablet (500 mg total) by mouth 2 (two) times daily. 09/16/16   Jerald KiefStephen K Chiu, MD  Magnesium Hydroxide (MILK OF MAGNESIA PO) Take 60 mLs by mouth daily as needed (diarrhea).    Historical Provider, MD  metroNIDAZOLE (FLAGYL) 500 MG tablet Take 1 tablet (500 mg total) by mouth every 8 (eight) hours. 09/16/16   Jerald KiefStephen K Chiu, MD     Family History  Problem Relation Age of Onset  . Diabetes type II Mother   . Diabetes type II Father   .  Hypertension Father     Social History   Social History  . Marital status: Single    Spouse name: N/A  . Number of children: N/A  . Years of education: N/A   Social History Main Topics  . Smoking status: Former Games developermoker  . Smokeless tobacco: Never Used  . Alcohol use Yes  . Drug use: No  . Sexual activity: Not on file   Other Topics Concern  . Not on file   Social History Narrative  . No narrative on file     Vital Signs: Vitals:   09/22/16 0900  BP: (!) 140/91  Pulse: 71  Temp: 97.8 F (36.6 C)      Physical Exam  Awake and alert. Heart is RRR. Lungs are clear to auscultation bilaterally. Abdomen is soft, nondistended, and nontender. Bowel sound are intact. LLQ abdominal drain is intact and site is clean, dry, and without erythrema or pain.  Imaging: No results found.  Labs:  CBC:  Recent Labs  09/12/16 0545 09/13/16 0424 09/14/16 0453 09/15/16 0531  WBC 8.8 7.2 6.6 5.5  HGB 12.0* 11.5* 12.0* 12.8*  HCT 36.0* 33.9* 37.0* 38.9*  PLT 410* 407* 438* 474*    COAGS:  Recent Labs  09/10/16 0210  INR 1.30  APTT 37*    BMP:  Recent Labs  09/11/16 0315 09/12/16 0545 09/13/16 0424 09/14/16 0453  NA 135 136 136 139  K 3.8 3.9 4.1 4.3  CL 102 100* 105 107  CO2 23  22 23 22   GLUCOSE 82 75 88 87  BUN 8 7 <5* <5*  CALCIUM 8.6* 8.9 8.6* 8.9  CREATININE 1.09 0.92 0.81 0.80  GFRNONAA >60 >60 >60 >60  GFRAA >60 >60 >60 >60    LIVER FUNCTION TESTS:  Recent Labs  09/09/16 1317  BILITOT 0.4  AST 14*  ALT 22  ALKPHOS 57  PROT 7.6  ALBUMIN 3.0*    Assessment: Patient with history of diverticulitis and drainage of a left lower quadrant abscess on 09/10/16. 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 Drain fluid cultures grew Escherichia coli. Patient has been on Cipro and Flagyl as outpatient. He is currently asymptomatic.Output from the drain has been minimal. Follow-up CT scan today reveals resolution of previously  drained abscess cavity. Drain injection today reveals no definitive fistula to the colon. Above findings were discussed with Dr. Andrey Campanile ([facility]) and decision was made to remove drain at this time. Patient will schedule follow-up with Dr. Andrey Campanile in 1-2 weeks for reassessment.   Signed: D. Jeananne Rama   09/22/2016, 9:22 AM   Please refer to Dr. Kirt Boys attestation of this note for management and plan.      Patient ID: Daniel Berger, male   DOB: April 22, 1981, 36 y.o.   MRN: [id number]

## 2016-09-30 ENCOUNTER — Inpatient Hospital Stay: Payer: Self-pay | Admitting: Family Medicine

## 2017-07-13 ENCOUNTER — Encounter (HOSPITAL_COMMUNITY): Payer: Self-pay | Admitting: Emergency Medicine

## 2017-07-13 ENCOUNTER — Inpatient Hospital Stay (HOSPITAL_COMMUNITY)
Admission: EM | Admit: 2017-07-13 | Discharge: 2017-07-18 | DRG: 392 | Disposition: A | Discharge status: Home / Self Care | Payer: Self-pay | Attending: General Surgery | Admitting: General Surgery

## 2017-07-13 ENCOUNTER — Other Ambulatory Visit: Payer: Self-pay

## 2017-07-13 ENCOUNTER — Emergency Department (HOSPITAL_COMMUNITY): Payer: Self-pay

## 2017-07-13 DIAGNOSIS — K5720 Diverticulitis of large intestine with perforation and abscess without bleeding: Principal | ICD-10-CM | POA: Diagnosis present

## 2017-07-13 DIAGNOSIS — J45909 Unspecified asthma, uncomplicated: Secondary | ICD-10-CM | POA: Diagnosis present

## 2017-07-13 DIAGNOSIS — Z87891 Personal history of nicotine dependence: Secondary | ICD-10-CM

## 2017-07-13 DIAGNOSIS — E878 Other disorders of electrolyte and fluid balance, not elsewhere classified: Secondary | ICD-10-CM | POA: Diagnosis present

## 2017-07-13 DIAGNOSIS — E871 Hypo-osmolality and hyponatremia: Secondary | ICD-10-CM | POA: Diagnosis present

## 2017-07-13 DIAGNOSIS — Z8249 Family history of ischemic heart disease and other diseases of the circulatory system: Secondary | ICD-10-CM

## 2017-07-13 DIAGNOSIS — Z833 Family history of diabetes mellitus: Secondary | ICD-10-CM

## 2017-07-13 HISTORY — DX: Unspecified chronic bronchitis: J42

## 2017-07-13 LAB — COMPREHENSIVE METABOLIC PANEL
ALK PHOS: 68 U/L (ref 38–126)
ALT: 18 U/L (ref 17–63)
ANION GAP: 10 (ref 5–15)
AST: 20 U/L (ref 15–41)
Albumin: 3.9 g/dL (ref 3.5–5.0)
BUN: 7 mg/dL (ref 6–20)
CALCIUM: 9.5 mg/dL (ref 8.9–10.3)
CO2: 26 mmol/L (ref 22–32)
Chloride: 96 mmol/L — ABNORMAL LOW (ref 101–111)
Creatinine, Ser: 1.05 mg/dL (ref 0.61–1.24)
GFR calc non Af Amer: >60 mL/min (ref >60)
GLUCOSE: 112 mg/dL — AB (ref 65–99)
POTASSIUM: 4.5 mmol/L (ref 3.5–5.1)
Sodium: 132 mmol/L — ABNORMAL LOW (ref 135–145)
TOTAL PROTEIN: 8.6 g/dL — AB (ref 6.5–8.1)
Total Bilirubin: 1.2 mg/dL (ref 0.3–1.2)

## 2017-07-13 LAB — CREATININE, SERUM
Creatinine, Ser: 0.99 mg/dL (ref 0.61–1.24)
GFR calc non Af Amer: >60 mL/min (ref >60)

## 2017-07-13 LAB — CBC
HEMATOCRIT: 40.9 % (ref 39.0–52.0)
HEMATOCRIT: 40.2 % (ref 39.0–52.0)
HEMOGLOBIN: 13.9 g/dL (ref 13.0–17.0)
Hemoglobin: 13.7 g/dL (ref 13.0–17.0)
MCH: 31.2 pg (ref 26.0–34.0)
MCH: 31.3 pg (ref 26.0–34.0)
MCHC: 34 g/dL (ref 30.0–36.0)
MCHC: 34.1 g/dL (ref 30.0–36.0)
MCV: 91.9 fL (ref 78.0–100.0)
MCV: 91.8 fL (ref 78.0–100.0)
Platelets: 233 10*3/uL (ref 150–400)
Platelets: 223 10*3/uL (ref 150–400)
RBC: 4.45 MIL/uL (ref 4.22–5.81)
RBC: 4.38 MIL/uL (ref 4.22–5.81)
RDW: 13.1 % (ref 11.5–15.5)
RDW: 12.9 % (ref 11.5–15.5)
WBC: 15.5 10*3/uL — ABNORMAL HIGH (ref 4.0–10.5)
WBC: 18 10*3/uL — AB (ref 4.0–10.5)

## 2017-07-13 LAB — URINALYSIS, ROUTINE W REFLEX MICROSCOPIC
BILIRUBIN URINE: NEGATIVE
Glucose, UA: NEGATIVE mg/dL
Hgb urine dipstick: NEGATIVE
Ketones, ur: NEGATIVE mg/dL
Leukocytes, UA: NEGATIVE
NITRITE: NEGATIVE
PH: 5 (ref 5.0–8.0)
Protein, ur: NEGATIVE mg/dL
SPECIFIC GRAVITY, URINE: 1.012 (ref 1.005–1.030)

## 2017-07-13 LAB — LIPASE, BLOOD: LIPASE: 21 U/L (ref 11–51)

## 2017-07-13 MED ORDER — MORPHINE SULFATE (PF) 4 MG/ML IV SOLN
4.0000 mg | Freq: Once | INTRAVENOUS | Status: AC
  Administered 2017-07-13: 4 mg via INTRAVENOUS
  Filled 2017-07-13: qty 1

## 2017-07-13 MED ORDER — HYDRALAZINE HCL 20 MG/ML IJ SOLN: 10.0000 mg | INTRAMUSCULAR | Status: DC | PRN

## 2017-07-13 MED ORDER — ONDANSETRON 4 MG PO TBDP: 4.0000 mg | ORAL_TABLET | Freq: Four times a day (QID) | ORAL | Status: DC | PRN

## 2017-07-13 MED ORDER — ACETAMINOPHEN 325 MG PO TABS: 650.0000 mg | ORAL_TABLET | Freq: Four times a day (QID) | ORAL | Status: DC | PRN

## 2017-07-13 MED ORDER — PANTOPRAZOLE SODIUM 40 MG IV SOLR
40.0000 mg | Freq: Every day | INTRAVENOUS | Status: DC
  Administered 2017-07-13 – 2017-07-17 (×5): 40 mg via INTRAVENOUS
  Filled 2017-07-13 (×5): qty 40

## 2017-07-13 MED ORDER — ACETAMINOPHEN 650 MG RE SUPP: 650.0000 mg | Freq: Four times a day (QID) | RECTAL | Status: DC | PRN

## 2017-07-13 MED ORDER — ENOXAPARIN SODIUM 40 MG/0.4ML ~~LOC~~ SOLN
40.0000 mg | SUBCUTANEOUS | Status: DC
  Administered 2017-07-14 – 2017-07-17 (×4): 40 mg via SUBCUTANEOUS
  Filled 2017-07-13 (×5): qty 0.4

## 2017-07-13 MED ORDER — ONDANSETRON HCL 4 MG/2ML IJ SOLN: 4.0000 mg | Freq: Four times a day (QID) | INTRAMUSCULAR | Status: DC | PRN

## 2017-07-13 MED ORDER — KETOROLAC TROMETHAMINE 30 MG/ML IJ SOLN
30.0000 mg | Freq: Four times a day (QID) | INTRAMUSCULAR | Status: DC | PRN
  Administered 2017-07-15 – 2017-07-16 (×3): 30 mg via INTRAVENOUS
  Filled 2017-07-13 (×4): qty 1

## 2017-07-13 MED ORDER — SODIUM CHLORIDE 0.9 % IV BOLUS (SEPSIS)
1000.0000 mL | Freq: Once | INTRAVENOUS | Status: AC
  Administered 2017-07-13: 1000 mL via INTRAVENOUS

## 2017-07-13 MED ORDER — IOPAMIDOL (ISOVUE-300) INJECTION 61%
INTRAVENOUS | Status: AC
  Administered 2017-07-13: 100 mL
  Filled 2017-07-13: qty 100

## 2017-07-13 MED ORDER — PIPERACILLIN-TAZOBACTAM 3.375 G IVPB
3.3750 g | Freq: Three times a day (TID) | INTRAVENOUS | Status: DC
  Administered 2017-07-13 – 2017-07-18 (×15): 3.375 g via INTRAVENOUS
  Filled 2017-07-13 (×15): qty 50

## 2017-07-13 MED ORDER — METRONIDAZOLE IN NACL 5-0.79 MG/ML-% IV SOLN
500.0000 mg | Freq: Once | INTRAVENOUS | Status: AC
  Administered 2017-07-13: 500 mg via INTRAVENOUS
  Filled 2017-07-13: qty 100

## 2017-07-13 MED ORDER — CIPROFLOXACIN IN D5W 400 MG/200ML IV SOLN
400.0000 mg | Freq: Once | INTRAVENOUS | Status: AC
  Administered 2017-07-13: 400 mg via INTRAVENOUS
  Filled 2017-07-13: qty 200

## 2017-07-13 MED ORDER — ALBUTEROL SULFATE (2.5 MG/3ML) 0.083% IN NEBU: 2.5000 mg | INHALATION_SOLUTION | Freq: Four times a day (QID) | RESPIRATORY_TRACT | Status: DC | PRN

## 2017-07-13 MED ORDER — ENOXAPARIN SODIUM 40 MG/0.4ML ~~LOC~~ SOLN
40.0000 mg | SUBCUTANEOUS | Status: DC
Start: 2017-07-14 — End: 2017-07-13

## 2017-07-13 MED ORDER — DEXTROSE-NACL 5-0.45 % IV SOLN
INTRAVENOUS | Status: DC
  Administered 2017-07-13 – 2017-07-14 (×3): via INTRAVENOUS

## 2017-07-13 MED ORDER — HYDROMORPHONE HCL 1 MG/ML IJ SOLN
0.5000 mg | INTRAMUSCULAR | Status: DC | PRN
  Administered 2017-07-13 – 2017-07-17 (×8): 1 mg via INTRAVENOUS
  Filled 2017-07-13 (×8): qty 1

## 2017-07-13 NOTE — ED Triage Notes (Signed)
Pt. Stated, I have leg pain and it makes my stomach cramp, its started 2 days ago. I ve had this to happen before.

## 2017-07-13 NOTE — Consult Note (Signed)
[name] Admission Note  Daniel Berger Sep 13, 1980  [id number].    Requesting MD: Daniel Berger Chief Complaint/Reason for Consult: Perforated diverticulitis HPI:  Patient is a 36 y.o. Male who presented to [facility] with 2 days of worsening abdominal pain in the LLQ. Pain is dull and does not radiate. Feels like the last time he had diverticulitis but worse. About 1 year ago the patient had his first episode of diverticulitis and had to have a drain placed. He did not follow up with anyone after the drain was removed. He has not had a colonoscopy. Patient denies fever, chills, chest pain, SOB, n/v, blood in stool, urinary symptoms. Patient does report associated loss of appetite and diarrhea this AM. PMH significant for asthma, takes daily albuterol. Does not take any blood thinning medications. Has not had any past surgeries. 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 He reports that he drinks 1-2 beers daily, smokes about 1 pack cigarettes per week. Denies illicit drug use.   ROS: Review of Systems  Constitutional: Negative for chills and fever.  Respiratory: Negative for cough, shortness of breath and wheezing.   Cardiovascular: Negative for chest pain and palpitations.  Gastrointestinal: Positive for abdominal pain and diarrhea. Negative for blood in stool, melena, nausea and vomiting.  Genitourinary: Negative for dysuria, frequency and urgency.  All other systems reviewed and are negative.   Family History  Problem Relation Age of Onset  . Diabetes type II Mother   . Diabetes type II Father   . Hypertension Father     Past Medical History:  Diagnosis Date  . Asthma   . Bronchitis     Past Surgical History:  Procedure Laterality Date  . IR GENERIC HISTORICAL  09/15/2016   IR SINUS/FIST TUBE CHK-NON GI 09/15/2016 Daniel Daft, MD [facility]-INTERV RAD  . IR GENERIC HISTORICAL  09/22/2016   IR RADIOLOGIST EVAL & MGMT 09/22/2016 Daniel Mariscal, MD [facility] INTERV RAD  . SURGERY OF LIP       Social History:  reports that he has quit smoking. he has never used smokeless tobacco. He reports that he drinks alcohol. He reports that he does not use drugs.  Allergies: No Known Allergies   (Not in a hospital admission)  Blood pressure (!) 127/99, pulse 82, temperature 98.6 F (37 C), temperature source Oral, resp. rate 16, height '5\' 6"'$  (1.676 m), weight 68 kg (150 lb), SpO2 93 %. Physical Exam: Physical Exam  Constitutional: He is oriented to person, place, and time. He appears well-developed and well-nourished. He is cooperative.  Non-toxic appearance. No distress.  HENT:  Head: Normocephalic and atraumatic.  Right Ear: External ear normal.  Left Ear: External ear normal.  Nose: Nose normal.  Mouth/Throat: Oropharynx is clear and moist and mucous membranes are normal.  Eyes: Conjunctivae, EOM and lids are normal. Pupils are equal, round, and reactive to light. No scleral icterus.  Neck: Normal range of motion and phonation normal. Neck supple. No thyromegaly present.  Cardiovascular: Normal rate, regular rhythm, S1 normal and S2 normal.  Pulses:      Radial pulses are 2+ on the right side, and 2+ on the left side.       Dorsalis pedis pulses are 2+ on the right side, and 2+ on the left side.  Pulmonary/Chest: Effort normal and breath sounds normal. He has no wheezes. He has no rhonchi. He has no rales.  Abdominal: Soft. Normal appearance and bowel sounds are normal. He exhibits no distension. There is  no hepatosplenomegaly. There is tenderness in the left lower quadrant. There is rebound (very mild). There is no rigidity and no guarding. No hernia.  Musculoskeletal:  ROM grossly intact in BL upper and lower extremities. No gross deformities in BL upper and lower extremities  Neurological: He is alert and oriented to person, place, and time. He has normal strength. No sensory deficit.  Skin: Skin is warm, dry and intact. No rash noted. He is not diaphoretic.  Psychiatric:  He has a normal mood and affect. His speech is normal and behavior is normal.    Results for orders placed or performed during the hospital encounter of 07/13/17 (from the past 48 hour(s))  Lipase, blood     Status: None   Collection Time: 07/13/17  8:45 AM  Result Value Ref Range   Lipase 21 11 - 51 U/L  Comprehensive metabolic panel     Status: Abnormal   Collection Time: 07/13/17  8:45 AM  Result Value Ref Range   Sodium 132 (L) 135 - 145 mmol/L   Potassium 4.5 3.5 - 5.1 mmol/L   Chloride 96 (L) 101 - 111 mmol/L   CO2 26 22 - 32 mmol/L   Glucose, Bld 112 (H) 65 - 99 mg/dL   BUN 7 6 - 20 mg/dL   Creatinine, Ser 1.05 0.61 - 1.24 mg/dL   Calcium 9.5 8.9 - 10.3 mg/dL   Total Protein 8.6 (H) 6.5 - 8.1 g/dL   Albumin 3.9 3.5 - 5.0 g/dL   AST 20 15 - 41 U/L   ALT 18 17 - 63 U/L   Alkaline Phosphatase 68 38 - 126 U/L   Total Bilirubin 1.2 0.3 - 1.2 mg/dL   GFR calc non Af Amer >60 >60 mL/min   GFR calc Af Amer >60 >60 mL/min    Comment: (NOTE) The eGFR has been calculated using the CKD EPI equation. This calculation has not been validated in all clinical situations. eGFR's persistently <60 mL/min signify possible Chronic Kidney Disease.    Anion gap 10 5 - 15  CBC     Status: Abnormal   Collection Time: 07/13/17  8:45 AM  Result Value Ref Range   WBC 15.5 (H) 4.0 - 10.5 K/uL   RBC 4.45 4.22 - 5.81 MIL/uL   Hemoglobin 13.9 13.0 - 17.0 g/dL   HCT 40.9 39.0 - 52.0 %   MCV 91.9 78.0 - 100.0 fL   MCH 31.2 26.0 - 34.0 pg   MCHC 34.0 30.0 - 36.0 g/dL   RDW 13.1 11.5 - 15.5 %   Platelets 233 150 - 400 K/uL  Urinalysis, Routine w reflex microscopic     Status: None   Collection Time: 07/13/17 11:00 AM  Result Value Ref Range   Color, Urine YELLOW YELLOW   APPearance CLEAR CLEAR   Specific Gravity, Urine 1.012 1.005 - 1.030   pH 5.0 5.0 - 8.0   Glucose, UA NEGATIVE NEGATIVE mg/dL   Hgb urine dipstick NEGATIVE NEGATIVE   Bilirubin Urine NEGATIVE NEGATIVE   Ketones, ur NEGATIVE  NEGATIVE mg/dL   Protein, ur NEGATIVE NEGATIVE mg/dL   Nitrite NEGATIVE NEGATIVE   Leukocytes, UA NEGATIVE NEGATIVE   Ct Abdomen Pelvis W Contrast  Result Date: 07/13/2017 CLINICAL DATA:  Abdominal pain. Diverticulitis suspected. History of diverticulitis. EXAM: CT ABDOMEN AND PELVIS WITH CONTRAST TECHNIQUE: Multidetector CT imaging of the abdomen and pelvis was performed using the standard protocol following bolus administration of intravenous contrast. CONTRAST:  175m ISOVUE-300 IOPAMIDOL (ISOVUE-300) INJECTION  61% COMPARISON:  Multiple CT scans since September 09, 2016. FINDINGS: Lower chest: No acute abnormality. Hepatobiliary: A low-attenuation focus in the right hepatic lobe on series 3, image 20 is probably a small benign cyst, unchanged based on coronal imaging since January 2018. No suspicious nodules or masses in the liver the gallbladder and portal vein are normal Pancreas: Unremarkable. No pancreatic ductal dilatation or surrounding inflammatory changes. Spleen: Normal in size without focal abnormality. Adrenals/Urinary Tract: Adrenal glands are unremarkable. Kidneys are normal, without renal calculi, focal lesion, or hydronephrosis. Bladder is unremarkable. Stomach/Bowel: The stomach and small bowel are normal. The inflammation in the rectum and distal sigmoid colon on the previous study has significantly improved. There is wall thickening associated with the proximal half of the sigmoid colon and the distal third of the descending colon. There is adjacent fat stranding. There is also extraluminal gas tracking into the left retroperitoneum anterior to the left psoas muscle. There is a complicated fluid collection along the posterior aspect of the descending colon as seen on series 3, image 52 measuring 3.5 by 1.2 cm consistent with an abscess. There is a small collection anterior to the rectum containing fluid and gas on series 3, image 66 measuring up to 2.5 cm. This appears to be connected to  the sigmoid colon as seen on images 63 63 and 64. This collection demonstrates peripheral enhancement. There is fat stranding adjacent to the sigmoid colon in the region of the connection between colon and fluid collection. No other sites of diverticulitis despite other diverticuli. The appendix is normal in appearance with no evidence of appendicitis. Vascular/Lymphatic: No significant vascular findings are present. No enlarged abdominal or pelvic lymph nodes. Reproductive: Prostate is unremarkable. Other: No abdominal wall hernia or abnormality. No abdominopelvic ascites. Musculoskeletal: No acute or significant osseous findings. IMPRESSION: 1. Wall thickening, pericolonic fat stranding, a pericolonic abscess, and extraluminal gas are most consistent with perforated diverticulitis affecting the distal descending colon and proximal sigmoid colon. If the patient has not had previous colonoscopy, consider colonoscopy upon resolution of symptoms to better evaluate the underlying colon given the amount of wall thickening today. 2. There is a collection of fluid and gas anterior to the rectum at a site of previous abscess. This appears to be connected to the sigmoid colon consistent with a fistulous connection. Stranding adjacent to the sigmoid colon in this region is consistent with ongoing diverticulitis. Findings called Dr. Jeneen Berger Electronically Signed   By: Dorise Bullion III M.D   On: 07/13/2017 12:05      Assessment/Plan Asthma - home albuterol   Diverticulitis with perforation and abscess formation - CT: diverticulitis of sigmoid and distal descending colon with abscess measuring 3.5 x 1.2 cm along posterior descending colon; second fluid collection anterior to the rectum 2.5 cm, concern of possible fistulous connection to sigmoid colon - patient is TTP in the LLQ, no peritonitis or indication for acute surgical intervention - WBC 15.5, afebrile - admit to med-surg - have consulted Interventional  radiology for percutaneous drainage - discussed with patient that we would like to try to get him over this acute episode and then see him to discuss elective surgery down the line when he is well, but that there is the possibility that he could require surgery this admission if he does not improve with conservative management.   FEN: NPO, IVF VTE: SCDs, lovenox ID: cipro/flagyl (11/15); Zosyn (11/15>>)  Brigid Re, [facility] Surgery 07/13/2017, 12:56 PM Pager: [phone] Consults: [phone] Mon-Fri  7:00 am-4:30 pm Sat-Sun 7:00 am-11:30 am

## 2017-07-13 NOTE — Progress Notes (Signed)
Patient with history of diverticulitis with perforation in January 2018 s/p drain placement in IR.  Ultimately he improved and drain was removed.  He was expected to follow-up with surgery for possible bowel resection, however this did not occur.  [facility] with [facility].   CT Abdomen shows: 1. Wall thickening, pericolonic fat stranding, a pericolonic abscess, and extraluminal gas are most consistent with perforated diverticulitis affecting the distal descending colon and proximal sigmoid colon. If the patient has not had previous colonoscopy, consider colonoscopy upon resolution of symptoms to better evaluate the underlying colon given the amount of wall thickening today. 2. There is a collection of fluid and gas anterior to the rectum at a site of previous abscess. This appears to be connected to the sigmoid colon consistent with a fistulous connection. Stranding adjacent to the sigmoid colon in this region is consistent with ongoing diverticulitis.  IR consulted for possible aspiration and drainage.  Reviewed imaging and case with Dr. Lowella DandyHenn who notes the pelvic collection is small and likely not amenable to drainage.  There is marked inflammation in LLQ but no well-defined abscess amenable to percutaneous drainage.  No procedure planned in IR at this time.   IR available if repeat imaging shows changes in collections.   Loyce DysKacie Bernhard Koskinen, MS RD PA-C

## 2017-07-13 NOTE — ED Notes (Signed)
Report to Aurea GraffJoan on floor given by this nurse

## 2017-07-13 NOTE — ED Provider Notes (Signed)
[facility]   CSN: [id number] Arrival date & time: 07/13/17  [id number]     History   Chief Complaint Chief Complaint  Patient presents with  . Abdominal Pain  . Leg Pain    HPI Daniel Berger is a 36 y.o. male.  Patient with a history of Diverticulosis, and asthma presents with abdominal pain and leg pain. Patient states he has been experiencing 2 days of LLQ/Suprpubic pain; described as a constant 6-8/10 sharp/crampy/bloated pain with intermittent radiation to his L inner thigh. The pain is worsened with movement especially of he LLE. The is improved when laying on his left side, leaning forward, and pepto-bismuol. He has also tried milk of magnesia without relief.  He denies constipation or diarrhea, but state he bowl movements have been darker, last bowl movement this morning. He endorses a decreased appetite over the last couple of days. Denies fever, chills, Nausea, vomiting, constipation, diarrhea, or bloody stools.      Past Medical History:  Diagnosis Date  . Asthma   . Bronchitis     Patient Active Problem List   Diagnosis Date Noted  . Abscess of male pelvis (HCC) 09/10/2016  . Alcohol use 09/10/2016  . Asthma   . Diverticulitis of large intestine with abscess without bleeding     Past Surgical History:  Procedure Laterality Date  . IR GENERIC HISTORICAL  09/15/2016   IR SINUS/FIST TUBE CHK-NON GI 09/15/2016 Richarda OverlieAdam Henn, MD [facility]-INTERV RAD  . IR GENERIC HISTORICAL  09/22/2016   IR RADIOLOGIST EVAL & MGMT 09/22/2016 Simonne ComeJohn Watts, MD [facility] INTERV RAD  . SURGERY OF LIP         Home Medications    Prior to Admission medications   Medication Sig Start Date End Date Taking? Authorizing Provider  albuterol (PROVENTIL HFA;VENTOLIN HFA) 108 (90 Base) MCG/ACT inhaler Inhale 2 puffs into the lungs every 6 (six) hours as needed for wheezing or shortness of breath.   Yes [provider]  bismuth subsalicylate  (PEPTO BISMOL) 262 MG/15ML suspension Take 30 mLs every 6 (six) hours as needed by mouth (cramps).   Yes [provider]  Magnesium Hydroxide (MILK OF MAGNESIA PO) Take 60 mLs by mouth daily as needed (diarrhea).    [provider]    Family History Family History  Problem Relation Age of Onset  . Diabetes type II Mother   . Diabetes type II Father   . Hypertension Father     Social History Social History   Tobacco Use  . Smoking status: Former Games developermoker  . Smokeless tobacco: Never Used  Substance Use Topics  . Alcohol use: Yes  . Drug use: No     Allergies   Patient has no known allergies.   Review of Systems Review of Systems  All other systems reviewed and are negative.    Physical Exam Updated Vital Signs BP (!) 129/94   Pulse 81   Temp 98.6 F (37 C) (Oral)   Resp 20   Ht 5\' 6"  (1.676 m)   Wt 68 kg (150 lb)   SpO2 93%   BMI 24.21 kg/m   Physical Exam  Constitutional: He is oriented to person, place, and time. He appears well-developed and well-nourished.  HENT:  Head: Normocephalic and atraumatic.  Eyes: Conjunctivae and EOM are normal.  Cardiovascular: Normal rate, regular rhythm and normal heart sounds.  No murmur heard. Pulmonary/Chest: Effort normal and breath sounds normal. No respiratory distress.  Abdominal:  Soft. There is tenderness.  Tender to palpation @ LLQ and Suprapubic region  Musculoskeletal: He exhibits no edema or deformity.  Neurological: He is alert and oriented to person, place, and time.  Skin: Skin is warm and dry.  Psychiatric: He has a normal mood and affect.  Nursing note and vitals reviewed.    ED Treatments / Results  Labs (all labs ordered are listed, but only abnormal results are displayed) Labs Reviewed  COMPREHENSIVE METABOLIC PANEL - Abnormal; Notable for the following components:      Result Value   Sodium 132 (*)    Chloride 96 (*)    Glucose, Bld 112 (*)    Total Protein 8.6 (*)    All  other components within normal limits  CBC - Abnormal; Notable for the following components:   WBC 15.5 (*)    All other components within normal limits  LIPASE, BLOOD  URINALYSIS, ROUTINE W REFLEX MICROSCOPIC    EKG  EKG Interpretation None       Radiology No results found.  Procedures Procedures (including critical care time)  Medications Ordered in ED Medications  ciprofloxacin (CIPRO) IVPB 400 mg (not administered)  metroNIDAZOLE (FLAGYL) IVPB 500 mg (not administered)  sodium chloride 0.9 % bolus 1,000 mL (1,000 mLs Intravenous New Bag/Given 07/13/17 1037)  morphine 4 MG/ML injection 4 mg (4 mg Intravenous Given 07/13/17 1038)  iopamidol (ISOVUE-300) 61 % injection (100 mLs  Contrast Given 07/13/17 1129)     Initial Impression / Assessment and Plan / ED Course  I have reviewed the triage vital signs and the nursing notes.  Pertinent labs & imaging results that were available during my care of the patient were reviewed by me and considered in my medical decision making (see chart for details).    Patient with 2d LLQ/Suprapubic pain worsened with flexion of L hip. Concern for diverticulitis given his presentation, elevated WBC (15.5) and history of diverticulosis. Additional concern for possible abscess with his history of previous abscess and pain with movement of his left leg. Patient currently afebrile with stable vital signs. Will obtain CT Abd/Pelvis and provided IVF and morphine for pain control. - Lipase: WNL - CMP: Mild hypochloremic hyponatremia, otherwise without significant abnormality - U/A: Normal - CT Abd/Pelvis: Pending   Final Clinical Impressions(s) / ED Diagnoses   Final diagnoses:  None    ED Discharge Orders    None       Beola CordMelvin, Bensyn Bornemann, MD 07/13/17 1156    Rolland PorterJames, Mark, MD 07/13/17 1329

## 2017-07-14 LAB — BASIC METABOLIC PANEL
ANION GAP: 9 (ref 5–15)
BUN: 6 mg/dL (ref 6–20)
CALCIUM: 8.6 mg/dL — AB (ref 8.9–10.3)
CO2: 22 mmol/L (ref 22–32)
Chloride: 100 mmol/L — ABNORMAL LOW (ref 101–111)
Creatinine, Ser: 1.03 mg/dL (ref 0.61–1.24)
GFR calc Af Amer: >60 mL/min (ref >60)
GFR calc non Af Amer: >60 mL/min (ref >60)
GLUCOSE: 123 mg/dL — AB (ref 65–99)
Potassium: 3.6 mmol/L (ref 3.5–5.1)
Sodium: 131 mmol/L — ABNORMAL LOW (ref 135–145)

## 2017-07-14 LAB — CBC
HEMATOCRIT: 35.7 % — AB (ref 39.0–52.0)
Hemoglobin: 12.1 g/dL — ABNORMAL LOW (ref 13.0–17.0)
MCH: 30.8 pg (ref 26.0–34.0)
MCHC: 33.9 g/dL (ref 30.0–36.0)
MCV: 90.8 fL (ref 78.0–100.0)
PLATELETS: 231 10*3/uL (ref 150–400)
RBC: 3.93 MIL/uL — AB (ref 4.22–5.81)
RDW: 12.8 % (ref 11.5–15.5)
WBC: 12.5 10*3/uL — ABNORMAL HIGH (ref 4.0–10.5)

## 2017-07-14 LAB — HIV ANTIBODY (ROUTINE TESTING W REFLEX): HIV Screen 4th Generation wRfx: NONREACTIVE

## 2017-07-14 MED ORDER — KCL IN DEXTROSE-NACL 20-5-0.9 MEQ/L-%-% IV SOLN
INTRAVENOUS | Status: DC
  Administered 2017-07-14 – 2017-07-18 (×7): via INTRAVENOUS
  Filled 2017-07-14 (×10): qty 1000

## 2017-07-14 NOTE — Progress Notes (Signed)
CC: Left lower quadrant abdominal pain  Subjective: Patient is doing much better this a.m.  He could not stand up or walk yesterday today and feels much better.  Objective: Vital signs in last 24 hours: Temp:  [98.2 F (36.8 C)-100.3 F (37.9 C)] 98.2 F (36.8 C) (11/16 0524) Pulse Rate:  [77-94] 77 (11/16 0524) Resp:  [16-17] 17 (11/16 0524) BP: (118-148)/(80-122) 128/82 (11/16 0524) SpO2:  [91 %-100 %] 95 % (11/16 0524) Weight:  [62.5 kg (137 lb 12.6 oz)] 62.5 kg (137 lb 12.6 oz) (11/15 1549) Last BM Date: 07/14/17 NPO 1387 IV 1700 urine TM 100.3 VSS NA 131, K 3.6 WBC down to 12.5    Intake/Output from previous day: 11/15 0701 - 11/16 0700 In: 1387.5 [I.V.:137.5; IV Piggyback:1250] Out: 1700 [Urine:1700] Intake/Output this shift: No intake/output data recorded.  General appearance: alert, cooperative and no distress Resp: clear to auscultation bilaterally GI: Soft still tender left lower quadrant but much better than yesterday.  Lab Results:  Recent Labs    07/13/17 1552 07/14/17 0352  WBC 18.0* 12.5*  HGB 13.7 12.1*  HCT 40.2 35.7*  PLT 223 231    BMET Recent Labs    07/13/17 0845 07/13/17 1552 07/14/17 0352  NA 132*  --  131*  K 4.5  --  3.6  CL 96*  --  100*  CO2 26  --  22  GLUCOSE 112*  --  123*  BUN 7  --  6  CREATININE 1.05 0.99 1.03  CALCIUM 9.5  --  8.6*   PT/INR No results for input(s): LABPROT, INR in the last 72 hours.  Recent Labs  Lab 07/13/17 0845  AST 20  ALT 18  ALKPHOS 68  BILITOT 1.2  PROT 8.6*  ALBUMIN 3.9     Lipase     Component Value Date/Time   LIPASE 21 07/13/2017 0845     Prior to Admission medications   Medication Sig Start Date End Date Taking? Authorizing Provider  albuterol (PROVENTIL HFA;VENTOLIN HFA) 108 (90 Base) MCG/ACT inhaler Inhale 2 puffs into the lungs every 6 (six) hours as needed for wheezing or shortness of breath.   Yes [provider]    Medications: . enoxaparin  (LOVENOX) injection  40 mg Subcutaneous Q24H  . pantoprazole (PROTONIX) IV  40 mg Intravenous QHS   . dextrose 5 % and 0.45% NaCl 125 mL/hr at 07/14/17 0536  . piperacillin-tazobactam (ZOSYN)  IV 3.375 g (07/14/17 0530)   Anti-infectives (From admission, onward)   Start     Dose/Rate Route Frequency Ordered Stop   07/13/17 1400  piperacillin-tazobactam (ZOSYN) IVPB 3.375 g     3.375 g 12.5 mL/hr over 240 Minutes Intravenous Every 8 hours 07/13/17 1345     07/13/17 1200  ciprofloxacin (CIPRO) IVPB 400 mg     400 mg 200 mL/hr over 60 Minutes Intravenous  Once 07/13/17 1148 07/13/17 1355   07/13/17 1200  metroNIDAZOLE (FLAGYL) IVPB 500 mg     500 mg 100 mL/hr over 60 Minutes Intravenous  Once 07/13/17 1148 07/13/17 1511       Assessment/Plan Recurrent diverticulitis with perforation IR/Dr. Lowella DandyHenn notes collection is not amenable to drainage.  History of tobacco use  FEN: IV fluids/NPO ID: Cipro/Flagyl x1 dose, Zosyn 07/13/17 =>> day 2 DVT:  Lovenox   Plan: Ongoing medical management.  Start him on sips of clears from the floor.  Recheck labs in a.m.    LOS: 1 day    Skylan Lara  07/14/2017 [phone]  

## 2017-07-14 NOTE — Progress Notes (Signed)
Report obtained from prior nurse.  Charge nurse to oversea patients until I arrive back to unit around 1620 after lunch 

## 2017-07-14 NOTE — Progress Notes (Signed)
Pt lying in bed.  Denies pain.  No s/s of any acute distress noted.  Call bell in reach.

## 2017-07-15 LAB — CBC
HCT: 34.3 % — ABNORMAL LOW (ref 39.0–52.0)
HEMOGLOBIN: 11.3 g/dL — AB (ref 13.0–17.0)
MCH: 30.1 pg (ref 26.0–34.0)
MCHC: 32.9 g/dL (ref 30.0–36.0)
MCV: 91.2 fL (ref 78.0–100.0)
Platelets: 248 10*3/uL (ref 150–400)
RBC: 3.76 MIL/uL — AB (ref 4.22–5.81)
RDW: 12.9 % (ref 11.5–15.5)
WBC: 8.2 10*3/uL (ref 4.0–10.5)

## 2017-07-15 LAB — BASIC METABOLIC PANEL
ANION GAP: 9 (ref 5–15)
CHLORIDE: 104 mmol/L (ref 101–111)
CO2: 23 mmol/L (ref 22–32)
Calcium: 8.6 mg/dL — ABNORMAL LOW (ref 8.9–10.3)
Creatinine, Ser: 0.95 mg/dL (ref 0.61–1.24)
GFR calc Af Amer: >60 mL/min (ref >60)
GFR calc non Af Amer: >60 mL/min (ref >60)
Glucose, Bld: 100 mg/dL — ABNORMAL HIGH (ref 65–99)
Potassium: 3.9 mmol/L (ref 3.5–5.1)
SODIUM: 136 mmol/L (ref 135–145)

## 2017-07-15 MED ORDER — WHITE PETROLATUM EX OINT
TOPICAL_OINTMENT | CUTANEOUS | Status: AC
  Administered 2017-07-15: 14:00:00
  Filled 2017-07-15: qty 28.35

## 2017-07-15 NOTE — Plan of Care (Signed)
Educated patient on advancing diet slowly.

## 2017-07-15 NOTE — Progress Notes (Signed)
Pt tolerating sips of apple juice. Will monitor pt.

## 2017-07-15 NOTE — Progress Notes (Signed)
[facility] Progress Note     Subjective: CC: diverticulitis Patient states pain is much better. Just had a BM that was a little more formed, no blood present. Tolerating CLD, denies n/v. UOP good. VSS.  Objective: Vital signs in last 24 hours: Temp:  [98.2 F (36.8 C)-98.6 F (37 C)] 98.2 F (36.8 C) (11/17 0438) Pulse Rate:  [67-84] 67 (11/17 0438) Resp:  [16-18] 18 (11/17 0438) BP: (131-132)/(80-98) 131/98 (11/17 0438) SpO2:  [97 %-98 %] 98 % (11/17 0438) Last BM Date: 07/14/17  Intake/Output from previous day: 11/16 0701 - 11/17 0700 In: 2191.7 [P.O.:120; I.V.:1921.7; IV Piggyback:150] Out: 1000 [Urine:1000] Intake/Output this shift: Total I/O In: 112 [P.O.:112] Out: -   PE: Gen:  Alert, NAD, pleasant Card:  Regular rate and rhythm, pedal pulses 2+ BL Pulm:  Normal effort, clear to auscultation bilaterally Abd: Soft, non-tender, non-distended, bowel sounds present, no HSM Skin: warm and dry, no rashes  Psych: A&Ox3   Lab Results:  Recent Labs    07/14/17 0352 07/15/17 0346  WBC 12.5* 8.2  HGB 12.1* 11.3*  HCT 35.7* 34.3*  PLT 231 248   BMET Recent Labs    07/14/17 0352 07/15/17 0346  NA 131* 136  K 3.6 3.9  CL 100* 104  CO2 22 23  GLUCOSE 123* 100*  BUN 6 <5*  CREATININE 1.03 0.95  CALCIUM 8.6* 8.6*   PT/INR No results for input(s): LABPROT, INR in the last 72 hours. CMP     Component Value Date/Time   NA 136 07/15/2017 0346   K 3.9 07/15/2017 0346   CL 104 07/15/2017 0346   CO2 23 07/15/2017 0346   GLUCOSE 100 (H) 07/15/2017 0346   BUN <5 (L) 07/15/2017 0346   CREATININE 0.95 07/15/2017 0346   CALCIUM 8.6 (L) 07/15/2017 0346   PROT 8.6 (H) 07/13/2017 0845   ALBUMIN 3.9 07/13/2017 0845   AST 20 07/13/2017 0845   ALT 18 07/13/2017 0845   ALKPHOS 68 07/13/2017 0845   BILITOT 1.2 07/13/2017 0845   GFRNONAA >60 07/15/2017 0346   GFRAA >60 07/15/2017 0346   Lipase     Component Value Date/Time   LIPASE 21 07/13/2017  0845       Studies/Results: Ct Abdomen Pelvis W Contrast  Result Date: 07/13/2017 CLINICAL DATA:  Abdominal pain. Diverticulitis suspected. History of diverticulitis. EXAM: CT ABDOMEN AND PELVIS WITH CONTRAST TECHNIQUE: Multidetector CT imaging of the abdomen and pelvis was performed using the standard protocol following bolus administration of intravenous contrast. CONTRAST:  100mL ISOVUE-300 IOPAMIDOL (ISOVUE-300) INJECTION 61% COMPARISON:  Multiple CT scans since September 09, 2016. FINDINGS: Lower chest: No acute abnormality. Hepatobiliary: A low-attenuation focus in the right hepatic lobe on series 3, image 20 is probably a small benign cyst, unchanged based on coronal imaging since January 2018. No suspicious nodules or masses in the liver the gallbladder and portal vein are normal Pancreas: Unremarkable. No pancreatic ductal dilatation or surrounding inflammatory changes. Spleen: Normal in size without focal abnormality. Adrenals/Urinary Tract: Adrenal glands are unremarkable. Kidneys are normal, without renal calculi, focal lesion, or hydronephrosis. Bladder is unremarkable. Stomach/Bowel: The stomach and small bowel are normal. The inflammation in the rectum and distal sigmoid colon on the previous study has significantly improved. There is wall thickening associated with the proximal half of the sigmoid colon and the distal third of the descending colon. There is adjacent fat stranding. There is also extraluminal gas tracking into the left retroperitoneum anterior to the left psoas muscle.  There is a complicated fluid collection along the posterior aspect of the descending colon as seen on series 3, image 52 measuring 3.5 by 1.2 cm consistent with an abscess. There is a small collection anterior to the rectum containing fluid and gas on series 3, image 66 measuring up to 2.5 cm. This appears to be connected to the sigmoid colon as seen on images 63 63 and 64. This collection demonstrates peripheral  enhancement. There is fat stranding adjacent to the sigmoid colon in the region of the connection between colon and fluid collection. No other sites of diverticulitis despite other diverticuli. The appendix is normal in appearance with no evidence of appendicitis. Vascular/Lymphatic: No significant vascular findings are present. No enlarged abdominal or pelvic lymph nodes. Reproductive: Prostate is unremarkable. Other: No abdominal wall hernia or abnormality. No abdominopelvic ascites. Musculoskeletal: No acute or significant osseous findings. IMPRESSION: 1. Wall thickening, pericolonic fat stranding, a pericolonic abscess, and extraluminal gas are most consistent with perforated diverticulitis affecting the distal descending colon and proximal sigmoid colon. If the patient has not had previous colonoscopy, consider colonoscopy upon resolution of symptoms to better evaluate the underlying colon given the amount of wall thickening today. 2. There is a collection of fluid and gas anterior to the rectum at a site of previous abscess. This appears to be connected to the sigmoid colon consistent with a fistulous connection. Stranding adjacent to the sigmoid colon in this region is consistent with ongoing diverticulitis. Findings called Dr. Fayrene FearingJames Electronically Signed   By: Gerome Samavid  Williams III M.D   On: 07/13/2017 12:05    Anti-infectives: Anti-infectives (From admission, onward)   Start     Dose/Rate Route Frequency Ordered Stop   07/13/17 1400  piperacillin-tazobactam (ZOSYN) IVPB 3.375 g     3.375 g 12.5 mL/hr over 240 Minutes Intravenous Every 8 hours 07/13/17 1345     07/13/17 1200  ciprofloxacin (CIPRO) IVPB 400 mg     400 mg 200 mL/hr over 60 Minutes Intravenous  Once 07/13/17 1148 07/13/17 1355   07/13/17 1200  metroNIDAZOLE (FLAGYL) IVPB 500 mg     500 mg 100 mL/hr over 60 Minutes Intravenous  Once 07/13/17 1148 07/13/17 1511       Assessment/Plan Recurrent diverticulitis with  perforation IR/Dr. Lowella DandyHenn notes collection is not amenable to drainage. - WBC 8.2, afebrile - clinically improving - continue IV abx, advance diet to [place] - repeat CT with PO and IV contrast tomorrow morning  History of tobacco use  FEN: decrease IVF, FLD; start colace ID: Cipro/Flagyl x1 dose, Zosyn 07/13/17 =>> day 2 DVT:  Lovenox   Plan: Ongoing medical management. Advance diet. Recheck CT tomorrow    LOS: 2 days    Wells GuilesKelly Rayburn , [facility] 07/15/2017, 10:00 AM Pager: [phone] Consults: [phone] Mon-Fri 7:00 am-4:30 pm Sat-Sun 7:00 am-11:30 am

## 2017-07-16 ENCOUNTER — Inpatient Hospital Stay (HOSPITAL_COMMUNITY): Payer: Self-pay

## 2017-07-16 ENCOUNTER — Encounter (HOSPITAL_COMMUNITY): Payer: Self-pay | Admitting: Radiology

## 2017-07-16 MED ORDER — IOPAMIDOL (ISOVUE-300) INJECTION 61%
INTRAVENOUS | Status: AC
  Administered 2017-07-16: 100 mL
  Filled 2017-07-16: qty 100

## 2017-07-16 MED ORDER — IOPAMIDOL (ISOVUE-300) INJECTION 61%
INTRAVENOUS | Status: AC
  Filled 2017-07-16: qty 30

## 2017-07-16 NOTE — Progress Notes (Signed)
Subjective/Chief Complaint: Feels better. No fever. Less abd tenderness   Objective: Vital signs in last 24 hours: Temp:  [98 F (36.7 C)-98.3 F (36.8 C)] 98.3 F (36.8 C) (11/18 0545) Pulse Rate:  [63-65] 63 (11/18 0545) Resp:  [17-18] 18 (11/18 0545) BP: (130-144)/(88-93) 130/88 (11/18 0545) SpO2:  [100 %] 100 % (11/18 0545) Last BM Date: 07/15/17  Intake/Output from previous day: 11/17 0701 - 11/18 0700 In: 2924.5 [P.O.:1222; I.V.:1452.5; IV Piggyback:250] Out: -  Intake/Output this shift: No intake/output data recorded.  General appearance: alert and cooperative Resp: clear to auscultation bilaterally Cardio: regular rate and rhythm GI: soft, minimal tenderness  Lab Results:  Recent Labs    07/14/17 0352 07/15/17 0346  WBC 12.5* 8.2  HGB 12.1* 11.3*  HCT 35.7* 34.3*  PLT 231 248   BMET Recent Labs    07/14/17 0352 07/15/17 0346  NA 131* 136  K 3.6 3.9  CL 100* 104  CO2 22 23  GLUCOSE 123* 100*  BUN 6 <5*  CREATININE 1.03 0.95  CALCIUM 8.6* 8.6*   PT/INR No results for input(s): LABPROT, INR in the last 72 hours. ABG No results for input(s): PHART, HCO3 in the last 72 hours.  Invalid input(s): PCO2, PO2  Studies/Results: Ct Abdomen Pelvis W Contrast  Result Date: 07/16/2017 CLINICAL DATA:  Abdominal infection.  Diverticular abscess. EXAM: CT ABDOMEN AND PELVIS WITH CONTRAST TECHNIQUE: Multidetector CT imaging of the abdomen and pelvis was performed using the standard protocol following bolus administration of intravenous contrast. CONTRAST:  <See Chart> ISOVUE-300 IOPAMIDOL (ISOVUE-300) INJECTION 61% COMPARISON:  CT abdomen and pelvis 07/13/2017. FINDINGS: Lower chest: The lung bases are clear without focal nodule, mass, or airspace disease. Heart size is normal. No significant pleural or pericardial effusion is present. Hepatobiliary: No focal liver abnormality is seen. No gallstones, gallbladder wall thickening, or biliary dilatation.  Pancreas: Unremarkable. No pancreatic ductal dilatation or surrounding inflammatory changes. Spleen: Normal in size without focal abnormality. Adrenals/Urinary Tract: Adrenal glands are within normal limits bilaterally. Kidneys and ureters are unremarkable. The urinary bladder is within normal limits. Stomach/Bowel: The stomach and duodenum are within normal limits. Small bowel is within normal limits. Terminal ileum is unremarkable. The appendix is visualized and normal. Oral contrast is present within the ascending and transverse colon which are within normal limits. Proximal descending colon is normal. Inflammatory changes are again noted at the distal descending colon. An adjacent fluid and gas collection is enlarging, now measuring 2.4 x 1.9 x 1.5 cm. No definite mass lesion is present. Additional diverticular changes are present through the distal sigmoid colon. The previously noted abscess anterior to the distal sigmoid colon is relatively stable, measuring at 2.4 x 2.6 x 1.6 cm. This still has gas and fluid. Vascular/Lymphatic: No significant vascular findings are present. No enlarged abdominal or pelvic lymph nodes. Reproductive: Prostate is unremarkable. Other: No other significant free fluid is present. Musculoskeletal: Vertebral body heights and alignment are normal. IMPRESSION: 1. Sigmoid diverticulitis. 2. Enlarging pericolonic abscess posterior to the proximal sigmoid colon, now measuring 2.4 x 2.6 x 1.6 cm. The collection sits posterior and medial to the colon. 3. Stable appearance of small or abscess just anterior to the distal sigmoid colon and rectum measuring 2.5 cm maximally. Electronically Signed   By: Marin Robertshristopher  Mattern M.D.   On: 07/16/2017 06:47    Anti-infectives: Anti-infectives (From admission, onward)   Start     Dose/Rate Route Frequency Ordered Stop   07/13/17 1400  piperacillin-tazobactam (ZOSYN) IVPB 3.375  g     3.375 g 12.5 mL/hr over 240 Minutes Intravenous Every 8 hours  07/13/17 1345     07/13/17 1200  ciprofloxacin (CIPRO) IVPB 400 mg     400 mg 200 mL/hr over 60 Minutes Intravenous  Once 07/13/17 1148 07/13/17 1355   07/13/17 1200  metroNIDAZOLE (FLAGYL) IVPB 500 mg     500 mg 100 mL/hr over 60 Minutes Intravenous  Once 07/13/17 1148 07/13/17 1511      Assessment/Plan: s/p * No surgery found * Continue full liquids  Continue Zosyn for diverticulitis with abscess CT shows 2 small abscesses but they seem too small to drain Continue current plan  LOS: 3 days    TOTH III,Jashad Depaula S 07/16/2017

## 2017-07-16 NOTE — Progress Notes (Addendum)
Pt finished 2 bottles of contrast.   Pt down to CT scan.

## 2017-07-16 NOTE — Progress Notes (Signed)
Started pt on taking contrast drink in preparation for abdominal scan. Educated pt. PT verbalized understanding.

## 2017-07-17 NOTE — Progress Notes (Addendum)
Subjective: Feeling better.  Tolerating full liquids.  Had a couple of bowel movements.  No pain. Afebrile. Objective: Vital signs in last 24 hours: Temp:  [98.2 F (36.8 C)-98.5 F (36.9 C)] 98.5 F (36.9 C) (11/19 0550) Pulse Rate:  [56-70] 56 (11/19 0550) Resp:  [18] 18 (11/19 0550) BP: (120-132)/(86-90) 120/90 (11/19 0550) SpO2:  [99 %-100 %] 99 % (11/19 0550) Last BM Date: 07/15/17  Intake/Output from previous day: 11/18 0701 - 11/19 0700 In: 1756.7 [P.O.:600; I.V.:1006.7; IV Piggyback:150] Out: -  Intake/Output this shift: No intake/output data recorded.  General appearance: no distress and alert.  No distress.  Friendly and cooperative Resp: clear to auscultation bilaterally Cardio: regular rate and rhythm, S1, S2 normal, no murmur, click, rub or gallop GI: soft.  Nondistended.  Nontender.  No mass.  Lab Results:  Recent Labs    07/15/17 0346  WBC 8.2  HGB 11.3*  HCT 34.3*  PLT 248   BMET Recent Labs    07/15/17 0346  NA 136  K 3.9  CL 104  CO2 23  GLUCOSE 100*  BUN <5*  CREATININE 0.95  CALCIUM 8.6*   PT/INR No results for input(s): LABPROT, INR in the last 72 hours. ABG No results for input(s): PHART, HCO3 in the last 72 hours.  Invalid input(s): PCO2, PO2  Studies/Results: Ct Abdomen Pelvis W Contrast  Result Date: 07/16/2017 CLINICAL DATA:  Abdominal infection.  Diverticular abscess. EXAM: CT ABDOMEN AND PELVIS WITH CONTRAST TECHNIQUE: Multidetector CT imaging of the abdomen and pelvis was performed using the standard protocol following bolus administration of intravenous contrast. CONTRAST:  <See Chart> ISOVUE-300 IOPAMIDOL (ISOVUE-300) INJECTION 61% COMPARISON:  CT abdomen and pelvis 07/13/2017. FINDINGS: Lower chest: The lung bases are clear without focal nodule, mass, or airspace disease. Heart size is normal. No significant pleural or pericardial effusion is present. Hepatobiliary: No focal liver abnormality is seen. No gallstones,  gallbladder wall thickening, or biliary dilatation. Pancreas: Unremarkable. No pancreatic ductal dilatation or surrounding inflammatory changes. Spleen: Normal in size without focal abnormality. Adrenals/Urinary Tract: Adrenal glands are within normal limits bilaterally. Kidneys and ureters are unremarkable. The urinary bladder is within normal limits. Stomach/Bowel: The stomach and duodenum are within normal limits. Small bowel is within normal limits. Terminal ileum is unremarkable. The appendix is visualized and normal. Oral contrast is present within the ascending and transverse colon which are within normal limits. Proximal descending colon is normal. Inflammatory changes are again noted at the distal descending colon. An adjacent fluid and gas collection is enlarging, now measuring 2.4 x 1.9 x 1.5 cm. No definite mass lesion is present. Additional diverticular changes are present through the distal sigmoid colon. The previously noted abscess anterior to the distal sigmoid colon is relatively stable, measuring at 2.4 x 2.6 x 1.6 cm. This still has gas and fluid. Vascular/Lymphatic: No significant vascular findings are present. No enlarged abdominal or pelvic lymph nodes. Reproductive: Prostate is unremarkable. Other: No other significant free fluid is present. Musculoskeletal: Vertebral body heights and alignment are normal. IMPRESSION: 1. Sigmoid diverticulitis. 2. Enlarging pericolonic abscess posterior to the proximal sigmoid colon, now measuring 2.4 x 2.6 x 1.6 cm. The collection sits posterior and medial to the colon. 3. Stable appearance of small or abscess just anterior to the distal sigmoid colon and rectum measuring 2.5 cm maximally. Electronically Signed   By: Marin Robertshristopher  Mattern M.D.   On: 07/16/2017 06:47    Anti-infectives: Anti-infectives (From admission, onward)   Start     Dose/Rate  Route Frequency Ordered Stop   07/13/17 1400  piperacillin-tazobactam (ZOSYN) IVPB 3.375 g     3.375  g 12.5 mL/hr over 240 Minutes Intravenous Every 8 hours 07/13/17 1345     07/13/17 1200  ciprofloxacin (CIPRO) IVPB 400 mg     400 mg 200 mL/hr over 60 Minutes Intravenous  Once 07/13/17 1148 07/13/17 1355   07/13/17 1200  metroNIDAZOLE (FLAGYL) IVPB 500 mg     500 mg 100 mL/hr over 60 Minutes Intravenous  Once 07/13/17 1148 07/13/17 1511      Assessment/Plan:   Recurrent diverticulitis with perforation.  Clinically improving -CT shows 2 small abscesses too small to drain.  Likely will respond to prolonged antibiotics -Advance to soft diet -continue IV antibiotics -probable discharge tomorrow -Needs 14 day course of antibiotics -Plan outpatient follow-up with Dr. Angelena Formhris White, colorectal surgeon in our practice.  Dr. Cliffton AstersWhite  is aware of this.  History of tobacco use  FEN: decrease IVF, FLD; start colace ZO:XWRUE/AVWUJW:Cipro/Flagyl x1 dose,Zosyn 07/13/17=>> day 2 DVT: Lovenox    LOS: 4 days    Daniel Berger 07/17/2017

## 2017-07-17 NOTE — Care Management Note (Signed)
Case Management Note  Patient Details  Name: Blain PaisShurcono A Houchin MRN: [id number] Date of Birth: 09/10/1980  Subjective/Objective:                    Action/Plan:  Provided and explained MATCH letter ( medication assistance) .   [facility] for July 24, 2017 at 1130. Patient has contact information.   Patient voiced understanding to all of the above. Expected Discharge Date:                  Expected Discharge Plan:  Home/Self Care  In-House Referral:     Discharge planning Services  CM Consult, [facility], [facility], Follow-up appt scheduled  Post Acute Care Choice:    Choice offered to:  Patient  DME Arranged:    DME Agency:     HH Arranged:    HH Agency:     Status of Service:  Completed, signed off  If discussed at [employer] Meetings, dates discussed:    Additional Comments:  Kingsley PlanWile, Damante Spragg Marie, RN 07/17/2017, 11:03 AM

## 2017-07-18 MED ORDER — AMOXICILLIN-POT CLAVULANATE 875-125 MG PO TABS: 1.0000 | ORAL_TABLET | Freq: Two times a day (BID) | ORAL | Status: DC

## 2017-07-18 MED ORDER — AMOXICILLIN-POT CLAVULANATE 875-125 MG PO TABS: 1.0000 | ORAL_TABLET | Freq: Two times a day (BID) | ORAL | 0 refills | Status: AC

## 2017-07-18 MED ORDER — SACCHAROMYCES BOULARDII 250 MG PO CAPS: ORAL_CAPSULE | ORAL | Status: AC

## 2017-07-18 MED ORDER — ACETAMINOPHEN 325 MG PO TABS: 650.0000 mg | ORAL_TABLET | Freq: Four times a day (QID) | ORAL | Status: AC | PRN

## 2017-07-18 NOTE — Discharge Summary (Signed)
Physician Discharge Summary  Patient ID: Daniel Berger MRN: [id number] DOB/AGE: 37/03/1981 37 y.o.  Admit date: 07/13/2017 Discharge date: 07/18/2017  Admission Diagnoses:  Recurrent diverticulitis with perforation Tobacco use  Discharge Diagnoses:  Same  Active Problems:   Diverticulitis of colon with perforation   PROCEDURES: None Chief complaint: Left lower quadrant pain. Hospital Course:  Patient is a 36 y.o. Male who [facility] with 2 days of worsening abdominal pain in the LLQ. Pain is dull and does not radiate. Feels like the last time he had diverticulitis but worse. About 1 year ago the patient had his first episode of diverticulitis and had to have a drain placed. He did not follow up with anyone after the drain was removed. He has not had a colonoscopy. Patient denies fever, chills, chest pain, SOB, n/v, blood in stool, urinary symptoms. Patient does report associated loss of appetite and diarrhea this AM. PMH significant for asthma, takes daily albuterol. Does not take any blood thinning medications. Has not had any past surgeries. 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 He reports that he drinks 1-2 beers daily, smokes about 1 pack cigarettes per week. Denies illicit drug use.   Patient was seen in the emergency department by Dr. Corliss Skainssuei and diverticulitis with small perforation.  CT scan shows diverticulitis sigmoid distal ascending colon with abscess measuring 3.5 x 1 cm posterior descending.  Second fluid collection was anterior to the rectum 2.5 cm with concerns for possible fistulous connection to the sigmoid colon.  He was started on IV antibiotics and made n.p.o.  Evaluation by interventional radiology was obtained.  It was their opinion that these sites were not amenable to drainage.  He was maintained on IV antibiotics, n.p.o. and bowel rest.  His pain is improved and is advanced.  By a.m. of 07/18/17 afebrile, he was advanced to a soft diet.  WBC returned  to normal, after 5.5 days of IV Zosyn he was converted to Augmentin and discharged home.  It was Dr. Jacinto HalimIngram's opinion he should have another 14 days of p.o. Augmentin.  He is to follow-up with wellness clinic, he is also to follow-up with Dr. Sherrilee Gilleshristian White who can do his colonoscopy for the future.  Condition at discharge: Improving.  CBC Latest Ref Rng & Units 07/15/2017 07/14/2017 07/13/2017  WBC 4.0 - 10.5 K/uL 8.2 12.5(H) 18.0(H)  Hemoglobin 13.0 - 17.0 g/dL 11.3(L) 12.1(L) 13.7  Hematocrit 39.0 - 52.0 % 34.3(L) 35.7(L) 40.2  Platelets 150 - 400 K/uL 248 231 223   CMP Latest Ref Rng & Units 07/15/2017 07/14/2017 07/13/2017  Glucose 65 - 99 mg/dL 782(N100(H) 562(Z123(H) -  BUN 6 - 20 mg/dL <3(Y<5(L) 6 -  Creatinine 8.650.61 - 1.24 mg/dL 7.840.95 6.961.03 2.950.99  Sodium 135 - 145 mmol/L 136 131(L) -  Potassium 3.5 - 5.1 mmol/L 3.9 3.6 -  Chloride 101 - 111 mmol/L 104 100(L) -  CO2 22 - 32 mmol/L 23 22 -  Calcium 8.9 - 10.3 mg/dL 2.8(U8.6(L) 1.3(K8.6(L) -  Total Protein 6.5 - 8.1 g/dL - - -  Total Bilirubin 0.3 - 1.2 mg/dL - - -  Alkaline Phos 38 - 126 U/L - - -  AST 15 - 41 U/L - - -  ALT 17 - 63 U/L - - -    CT scan 07/16/17:Sigmoid diverticulitis. 2. Enlarging pericolonic abscess posterior to the proximal sigmoid colon, now measuring 2.4 x 2.6 x 1.6 cm. The collection sits posterior and medial to the colon. 3.  Stable appearance of small or abscess just anterior to the distal sigmoid colon and rectum measuring 2.5 cm maximally  Disposition: 06-Home-Health Care Svc   Allergies as of 07/18/2017   No Known Allergies     Medication List    TAKE these medications   acetaminophen 325 MG tablet Commonly known as:  TYLENOL Take 2 tablets (650 mg total) every 6 (six) hours as needed by mouth for mild pain (or temp > 100).   albuterol 108 (90 Base) MCG/ACT inhaler Commonly known as:  PROVENTIL HFA;VENTOLIN HFA Inhale 2 puffs into the lungs every 6 (six) hours as needed for wheezing or shortness of breath.    amoxicillin-clavulanate 875-125 MG tablet Commonly known as:  AUGMENTIN Take 1 tablet every 12 (twelve) hours by mouth.   saccharomyces boulardii 250 MG capsule Commonly known as:  FLORASTOR Follow package directions and use for the next month.  28 pills usually in the package.      Follow-up Information    [facility]. Go to.   Why:  Appointment on July 24, 2017 at 1130 am  Contact information: [address] [place] [phone]       Andria MeuseWhite, Christopher M, MD Follow up.   Specialty:  General Surgery Why:  CAll for a follow up appointment in 30 days.  Call sooner if you have an issue.   Contact information: [address] [place] [address] [phone]           Signed: Sherrie GeorgeJENNINGS,Lanaiya Lantry 07/18/2017, 9:07 AM

## 2017-07-18 NOTE — Progress Notes (Signed)
  Subjective: Doing well.  Tolerated solid diet without pain or nausea.  Having bowel movements.  Asymptomatic and wants to go home.  Objective: Vital signs in last 24 hours: Temp:  [98.1 F (36.7 C)-98.5 F (36.9 C)] 98.5 F (36.9 C) (11/20 0509) Pulse Rate:  [60-69] 65 (11/20 0509) Resp:  [17-19] 19 (11/20 0509) BP: (105-132)/(75-92) 105/75 (11/20 0509) SpO2:  [99 %-100 %] 100 % (11/20 0509) Last BM Date: 07/17/17  Intake/Output from previous day: 11/19 0701 - 11/20 0700 In: 1898.3 [P.O.:1060; I.V.:838.3] Out: 1400 [Urine:1400] Intake/Output this shift: Total I/O In: 1178.3 [P.O.:340; I.V.:838.3] Out: 700 [Urine:700]  General appearance: Alert.  Pleasant.  Cooperative.  No distress Resp: clear to auscultation bilaterally GI: soft, non-tender; bowel sounds normal; no masses,  no organomegaly Extremities: extremities normal, atraumatic, no cyanosis or edema  Lab Results:  No results for input(s): WBC, HGB, HCT, PLT in the last 72 hours. BMET No results for input(s): NA, K, CL, CO2, GLUCOSE, BUN, CREATININE, CALCIUM in the last 72 hours. PT/INR No results for input(s): LABPROT, INR in the last 72 hours. ABG No results for input(s): PHART, HCO3 in the last 72 hours.  Invalid input(s): PCO2, PO2  Studies/Results: No results found.  Anti-infectives: Anti-infectives (From admission, onward)   Start     Dose/Rate Route Frequency Ordered Stop   07/18/17 1000  amoxicillin-clavulanate (AUGMENTIN) 875-125 MG per tablet 1 tablet     1 tablet Oral Every 12 hours 07/18/17 0619     07/13/17 1400  piperacillin-tazobactam (ZOSYN) IVPB 3.375 g  Status:  Discontinued     3.375 g 12.5 mL/hr over 240 Minutes Intravenous Every 8 hours 07/13/17 1345 07/18/17 0619   07/13/17 1200  ciprofloxacin (CIPRO) IVPB 400 mg     400 mg 200 mL/hr over 60 Minutes Intravenous  Once 07/13/17 1148 07/13/17 1355   07/13/17 1200  metroNIDAZOLE (FLAGYL) IVPB 500 mg     500 mg 100 mL/hr over 60  Minutes Intravenous  Once 07/13/17 1148 07/13/17 1511      Assessment/Plan:  Recurrent diverticulitis with perforation.  Clinically doing well without pain or tenderness -CT shows 2 small abscesses too small to drain.  Likely will respond to prolonged antibiotics -Discharge on soft diet.  Transition to high-fiber low-fat diet 3 or 4 weeks from now   -Needs 14 day course of antibiotics-Augmentin -Plan outpatient follow-up with Dr. Angelena Formhris White, colorectal surgeon in our practice.  Dr. Cliffton AstersWhite  is aware of this.  Will need colonoscopy in 6-8 weeks.  After that, consideration can be given to elective colon resection since his diverticulitis is recurrent and he has a complication of microperforation.  History of tobacco use  FEN:d/c IV.  Home today ID: Switch to Augmentin DVT: Lovenox     LOS: 5 days    Ernestene MentionHaywood M Jakylan Ron 07/18/2017

## 2017-07-18 NOTE — Progress Notes (Signed)
Daniel Berger discharged per MD order. Discussed with the patient and all questions fully answered.  VSS, Skin clean, dry and intact without evidence of skin break down, no evidence of skin tears noted.  IV catheter discontinued intact. Site without signs and symptoms of complications. Dressing and pressure applied.  An After Visit Summary was printed and given to the patient. Patient received prescription.  Discharge education completed with patient including follow up instructions, medication list, d/c activities limitations if indicated, with other d/c instructions as indicated by MD - patient able to verbalize understanding, all questions fully answered.   Patient instructed to return to ED, call 911, or call MD for any changes in condition.   Patient discharged home via private auto.

## 2017-07-18 NOTE — Discharge Instructions (Signed)
Diverticulitis Diverticulitis is inflammation or infection of small pouches in your colon that form when you have a condition called diverticulosis. The pouches in your colon are called diverticula. Your colon, or large intestine, is where water is absorbed and stool is formed. Complications of diverticulitis can include:  Bleeding.  Severe infection.  Severe pain.  Perforation of your colon.  Obstruction of your colon.  What are the causes? Diverticulitis is caused by bacteria. Diverticulitis happens when stool becomes trapped in diverticula. This allows bacteria to grow in the diverticula, which can lead to inflammation and infection. What increases the risk? People with diverticulosis are at risk for diverticulitis. Eating a diet that does not include enough fiber from fruits and vegetables may make diverticulitis more likely to develop. What are the signs or symptoms? Symptoms of diverticulitis may include:  Abdominal pain and tenderness. The pain is normally located on the left side of the abdomen, but may occur in other areas.  Fever and chills.  Bloating.  Cramping.  Nausea.  Vomiting.  Constipation.  Diarrhea.  Blood in your stool.  How is this diagnosed? Your health care provider will ask you about your medical history and do a physical exam. You may need to have tests done because many medical conditions can cause the same symptoms as diverticulitis. Tests may include:  Blood tests.  Urine tests.  Imaging tests of the abdomen, including X-rays and CT scans.  When your condition is under control, your health care provider may recommend that you have a colonoscopy. A colonoscopy can show how severe your diverticula are and whether something else is causing your symptoms. How is this treated? Most cases of diverticulitis are mild and can be treated at home. Treatment may include:  Taking over-the-counter pain medicines.  Following a clear liquid  diet.  Taking antibiotic medicines by mouth for 7-10 days.  More severe cases may be treated at a hospital. Treatment may include:  Not eating or drinking.  Taking prescription pain medicine.  Receiving antibiotic medicines through an IV tube.  Receiving fluids and nutrition through an IV tube.  Surgery.  Follow these instructions at home:  Follow your health care providers instructions carefully.  Follow a full liquid diet or other diet as directed by your health care provider. After your symptoms improve, your health care provider may tell you to change your diet. He or she may recommend you eat a high-fiber diet. Fruits and vegetables are good sources of fiber. Fiber makes it easier to pass stool.  Take fiber supplements or probiotics as directed by your health care provider.  Only take medicines as directed by your health care provider.  Keep all your follow-up appointments. Contact a health care provider if:  Your pain does not improve.  You have a hard time eating food.  Your bowel movements do not return to normal. Get help right away if:  Your pain becomes worse.  Your symptoms do not get better.  Your symptoms suddenly get worse.  You have a fever.  You have repeated vomiting.  You have bloody or black, tarry stools.  If you have recurrent pain, nausea, vomiting, fever, start feeling bad, call and get seen sooner than later.   This information is not intended to replace advice given to you by your health care provider. Make sure you discuss any questions you have with your health care provider. Document Released: 05/25/2005 Document Revised: 01/21/2016 Document Reviewed: 07/10/2013 Elsevier Interactive Patient Education  2017 [employer].  Low-Fiber Diet for the next 2 weeks - enjoy Thanksgiving!! Fiber is found in fruits, vegetables, and whole grains. A low-fiber diet restricts fibrous foods that are not digested in the small intestine. A diet  containing about 10-15 grams of fiber per day is considered low fiber. Low-fiber diets may be used to:  Promote healing and rest the bowel during intestinal flare-ups.  Prevent blockage of a partially obstructed or narrowed gastrointestinal tract.  Reduce fecal weight and volume.  Slow the movement of feces.  You may be on a low-fiber diet as a transitional diet following surgery, after an injury (trauma), or because of a short (acute) or lifelong (chronic) illness. Your health care provider will determine the length of time you need to stay on this diet. What do I need to know about a low-fiber diet? Always check the fiber content on the packaging's Nutrition Facts label, especially on foods from the grains list. Ask your dietitian if you have questions about specific foods that are related to your condition, especially if the food is not listed below. In general, a low-fiber food will have less than 2 g of fiber. What foods can I eat? Grains All breads and crackers made with white flour. Sweet rolls, doughnuts, waffles, pancakes, [place] toast, bagels. Pretzels, Melba toast, zwieback. Well-cooked cereals, such as cornmeal, farina, or cream cereals. Dry cereals that do not contain whole grains, fruit, or nuts, such as refined corn, wheat, rice, and oat cereals. Potatoes prepared any way without skins, plain pastas and noodles, refined white rice. Use white flour for baking and making sauces. Use allowed list of grains for casseroles, dumplings, and puddings. Vegetables Strained tomato and vegetable juices. Fresh lettuce, cucumber, spinach. Well-cooked (no skin or pulp) or canned vegetables, such as asparagus, bean sprouts, beets, carrots, green beans, mushrooms, potatoes, pumpkin, spinach, yellow squash, tomato sauce/puree, turnips, yams, and zucchini. Keep servings limited to  cup. Fruits All fruit juices except prune juice. Cooked or canned fruits without skin and seeds, such as applesauce,  apricots, cherries, fruit cocktail, grapefruit, grapes, mandarin oranges, melons, peaches, pears, pineapple, and plums. Fresh fruits without skin, such as apricots, avocados, bananas, melons, pineapple, nectarines, and peaches. Keep servings limited to  cup or 1 piece. Meat and Other Protein Sources Ground or well-cooked tender beef, ham, veal, lamb, pork, or poultry. Eggs, plain cheese. Fish, oysters, shrimp, lobster, and other seafood. Liver, organ meats. Smooth nut butters. Dairy All milk products and alternative dairy substitutes, such as soy, rice, almond, and coconut, not containing added whole nuts, seeds, or added fruit. Beverages Decaf coffee, fruit, and vegetable juices or smoothies (small amounts, with no pulp or skins, and with fruits from allowed list), sports drinks, herbal tea. Condiments Ketchup, mustard, vinegar, cream sauce, cheese sauce, cocoa powder. Spices in moderation, such as allspice, basil, bay leaves, celery powder or leaves, cinnamon, cumin powder, curry powder, ginger, mace, marjoram, onion or garlic powder, oregano, paprika, parsley flakes, ground pepper, rosemary, sage, savory, tarragon, thyme, and turmeric. Sweets and Desserts Plain cakes and cookies, pie made with allowed fruit, pudding, custard, cream pie. Gelatin, fruit, ice, sherbet, frozen ice pops. Ice cream, ice milk without nuts. Plain hard candy, honey, jelly, molasses, syrup, sugar, chocolate syrup, gumdrops, marshmallows. Limit overall sugar intake. Fats and Oil Margarine, butter, cream, mayonnaise, salad oils, plain salad dressings made from allowed foods. Choose healthy fats such as olive oil, canola oil, and omega-3 fatty acids (such as found in salmon or tuna) when possible. Other Bouillon, broth,  or cream soups made from allowed foods. Any strained soup. Casseroles or mixed dishes made with allowed foods. The items listed above may not be a complete list of recommended foods or beverages. Contact your  dietitian for more options. What foods are not recommended? Grains All whole wheat and whole grain breads and crackers. Multigrains, rye, bran seeds, nuts, or coconut. Cereals containing whole grains, multigrains, bran, coconut, nuts, raisins. Cooked or dry oatmeal, steel-cut oats. Coarse wheat cereals, granola. Cereals advertised as high fiber. Potato skins. Whole grain pasta, wild or brown rice. Popcorn. Coconut flour. Bran, buckwheat, corn bread, multigrains, rye, wheat germ. Vegetables Fresh, cooked or canned vegetables, such as artichokes, asparagus, beet greens, broccoli, Brussels sprouts, cabbage, celery, cauliflower, corn, eggplant, kale, legumes or beans, okra, peas, and tomatoes. Avoid large servings of any vegetables, especially raw vegetables. Fruits Fresh fruits, such as apples with or without skin, berries, cherries, figs, grapes, grapefruit, guavas, kiwis, mangoes, oranges, papayas, pears, persimmons, pineapple, and pomegranate. Prune juice and juices with pulp, stewed or dried prunes. Dried fruits, dates, raisins. Fruit seeds or skins. Avoid large servings of all fresh fruits. Meats and Other Protein Sources Tough, fibrous meats with gristle. Chunky nut butter. Cheese made with seeds, nuts, or other foods not recommended. Nuts, seeds, legumes (beans, including baked beans), dried peas, beans, lentils. Dairy Yogurt or cheese that contains nuts, seeds, or added fruit. Beverages Fruit juices with high pulp, prune juice. Caffeinated coffee and teas. Condiments Coconut, maple syrup, pickles, olives. Sweets and Desserts Desserts, cookies, or candies that contain nuts or coconut, chunky peanut butter, dried fruits. Jams, preserves with seeds, marmalade. Large amounts of sugar and sweets. Any other dessert made with fruits from the not recommended list. Other Soups made from vegetables that are not recommended or that contain other foods not recommended. The items listed above may not be  a complete list of foods and beverages to avoid. Contact your dietitian for more information. This information is not intended to replace advice given to you by your health care provider. Make sure you discuss any questions you have with your health care provider. Document Released: 02/04/2002 Document Revised: 01/21/2016 Document Reviewed: 07/08/2013 Elsevier Interactive Patient Education  2017 Elsevier Inc.   High-Fiber Diet  - You can start this in 2 weeks.    Fiber, also called dietary fiber, is a type of carbohydrate found in fruits, vegetables, whole grains, and beans. A high-fiber diet can have many health benefits. Your health care provider may recommend a high-fiber diet to help:  Prevent constipation. Fiber can make your bowel movements more regular.  Lower your cholesterol.  Relieve hemorrhoids, uncomplicated diverticulosis, or irritable bowel syndrome.  Prevent overeating as part of a weight-loss plan.  Prevent heart disease, type 2 diabetes, and certain cancers.  What is my plan? The recommended daily intake of fiber includes:  38 grams for men under age 37.  30 grams for men over age 37.  25 grams for women under age 37.  21 grams for women over age 37.  You can get the recommended daily intake of dietary fiber by eating a variety of fruits, vegetables, grains, and beans. Your health care provider may also recommend a fiber supplement if it is not possible to get enough fiber through your diet. What do I need to know about a high-fiber diet?  Fiber supplements have not been widely studied for their effectiveness, so it is better to get fiber through food sources.  Always check the fiber content  on thenutrition facts label of any prepackaged food. Look for foods that contain at least 5 grams of fiber per serving.  Ask your dietitian if you have questions about specific foods that are related to your condition, especially if those foods are not listed in the  following section.  Increase your daily fiber consumption gradually. Increasing your intake of dietary fiber too quickly may cause bloating, cramping, or gas.  Drink plenty of water. Water helps you to digest fiber. What foods can I eat? Grains Whole-grain breads. Multigrain cereal. Oats and oatmeal. Brown rice. Barley. Bulgur wheat. Millet. Bran muffins. Popcorn. Rye wafer crackers. Vegetables Sweet potatoes. Spinach. Kale. Artichokes. Cabbage. Broccoli. Green peas. Carrots. Squash. Fruits Berries. Pears. Apples. Oranges. Avocados. Prunes and raisins. Dried figs. Meats and Other Protein Sources [place], kidney, pinto, and soy beans. Split peas. Lentils. Nuts and seeds. Dairy Fiber-fortified yogurt. Beverages Fiber-fortified soy milk. Fiber-fortified orange juice. Other Fiber bars. The items listed above may not be a complete list of recommended foods or beverages. Contact your dietitian for more options. What foods are not recommended? Grains White bread. Pasta made with refined flour. White rice. Vegetables Fried potatoes. Canned vegetables. Well-cooked vegetables. Fruits Fruit juice. Cooked, strained fruit. Meats and Other Protein Sources Fatty cuts of meat. Fried Environmental education officerpoultry or fried fish. Dairy Milk. Yogurt. Cream cheese. Sour cream. Beverages Soft drinks. Other Cakes and pastries. Butter and oils. The items listed above may not be a complete list of foods and beverages to avoid. Contact your dietitian for more information. What are some tips for including high-fiber foods in my diet?  Eat a wide variety of high-fiber foods.  Make sure that half of all grains consumed each day are whole grains.  Replace breads and cereals made from refined flour or white flour with whole-grain breads and cereals.  Replace white rice with brown rice, bulgur wheat, or millet.  Start the day with a breakfast that is high in fiber, such as a cereal that contains at least 5 grams of fiber per  serving.  Use beans in place of meat in soups, salads, or pasta.  Eat high-fiber snacks, such as berries, raw vegetables, nuts, or popcorn. This information is not intended to replace advice given to you by your health care provider. Make sure you discuss any questions you have with your health care provider. Document Released: 08/15/2005 Document Revised: 01/21/2016 Document Reviewed: 01/28/2014 Elsevier Interactive Patient Education  2017 [employer]

## 2017-07-24 ENCOUNTER — Ambulatory Visit: Payer: Self-pay | Attending: Internal Medicine | Admitting: Physician Assistant

## 2017-07-24 VITALS — BP 127/82 | HR 60 | Temp 99.1°F | Resp 16 | Wt 145.0 lb

## 2017-07-24 DIAGNOSIS — K5732 Diverticulitis of large intestine without perforation or abscess without bleeding: Secondary | ICD-10-CM | POA: Insufficient documentation

## 2017-07-24 DIAGNOSIS — Z833 Family history of diabetes mellitus: Secondary | ICD-10-CM | POA: Insufficient documentation

## 2017-07-24 DIAGNOSIS — K5790 Diverticulosis of intestine, part unspecified, without perforation or abscess without bleeding: Secondary | ICD-10-CM

## 2017-07-24 DIAGNOSIS — Z79899 Other long term (current) drug therapy: Secondary | ICD-10-CM | POA: Insufficient documentation

## 2017-07-24 DIAGNOSIS — J45909 Unspecified asthma, uncomplicated: Secondary | ICD-10-CM | POA: Insufficient documentation

## 2017-07-24 DIAGNOSIS — Z8249 Family history of ischemic heart disease and other diseases of the circulatory system: Secondary | ICD-10-CM | POA: Insufficient documentation

## 2017-07-24 DIAGNOSIS — F17200 Nicotine dependence, unspecified, uncomplicated: Secondary | ICD-10-CM | POA: Insufficient documentation

## 2017-07-24 NOTE — Patient Instructions (Signed)
Andria MeuseWhite, Christopher M, MD Follow up.   Specialty:  General Surgery Why:  CAll for a follow up appointment in 30 days.  Call sooner if you have an issue.   Contact information: [address] [place] [address] [phone]    Make an appt for next month for follow up
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
## 2019-07-02 IMAGING — CT CT ABD-PELV W/ CM
2 of 4 series · 15 of 46 positions shown, 17 images · IV contrast (iopamidol)
Comparison: Multiple CT scans since September 09, 2016.

CLINICAL DATA: Abdominal pain. Diverticulitis suspected. History of
diverticulitis.

EXAM:
CT ABDOMEN AND PELVIS WITH CONTRAST
TECHNIQUE: Multidetector CT imaging of the abdomen and pelvis was performed
using the standard protocol following bolus administration of
intravenous contrast.
CONTRAST:  100mL [id number] IOPAMIDOL ([id number]) INJECTION 61%

[Series 3: a/p w/ 5mm · axial · 0.73mm/px · z∈[+903,+1298]mm · 12 of 91 slices shown, 14 images]
[im 8/91  soft-tissue]
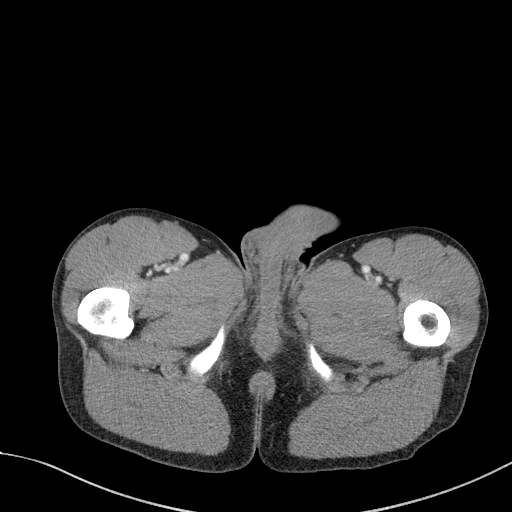
[im 8/91  bone]
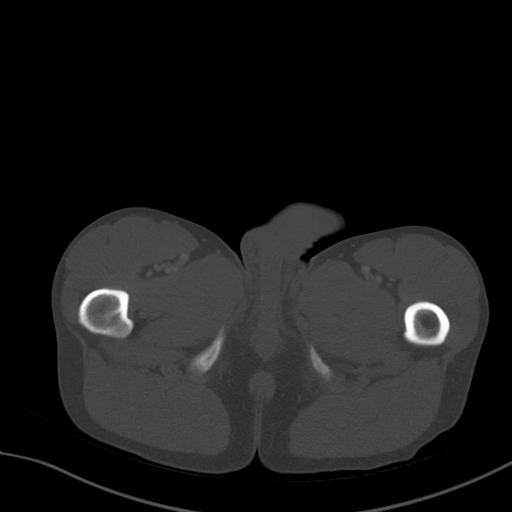
[im 15/91  soft-tissue]
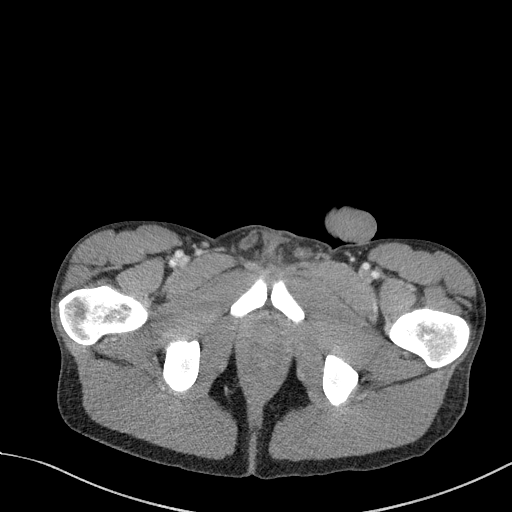
[im 22/91  soft-tissue]
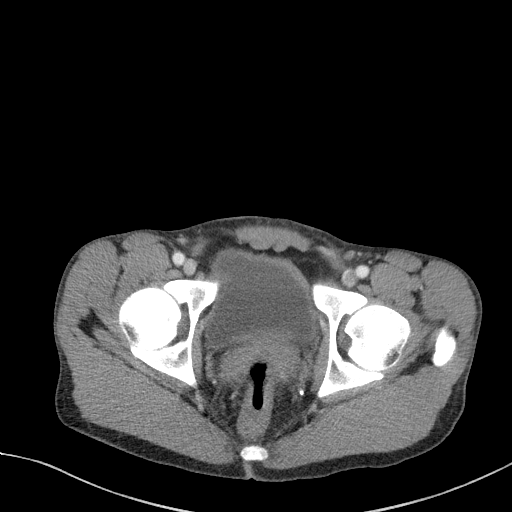
[im 29/91  soft-tissue]
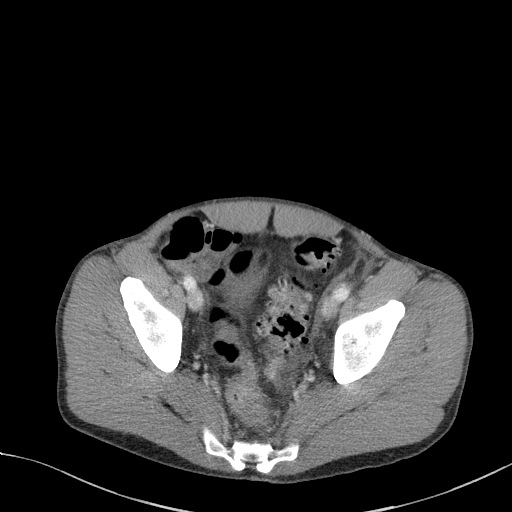
[im 37/91  soft-tissue]
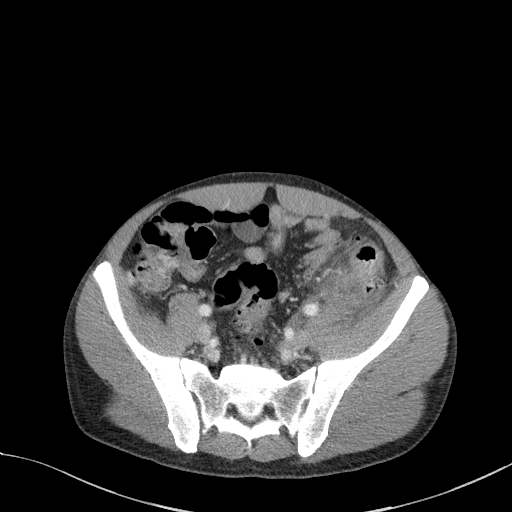
[im 44/91  soft-tissue]
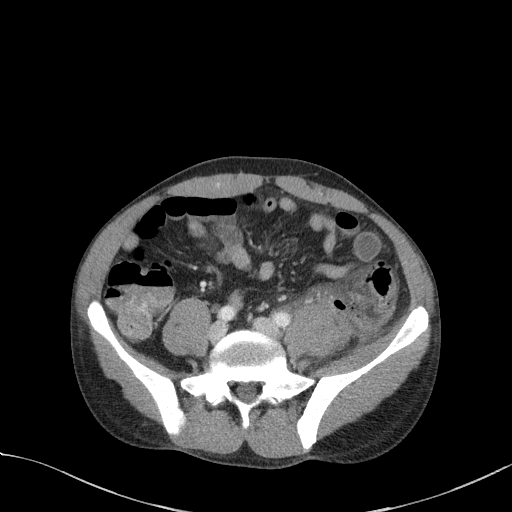
[im 51/91  soft-tissue]
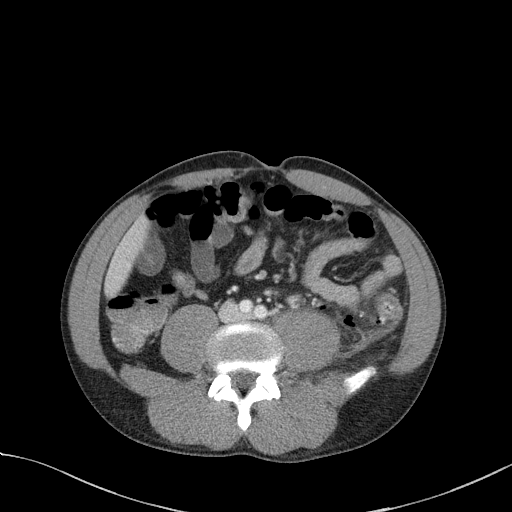
[im 58/91  soft-tissue]
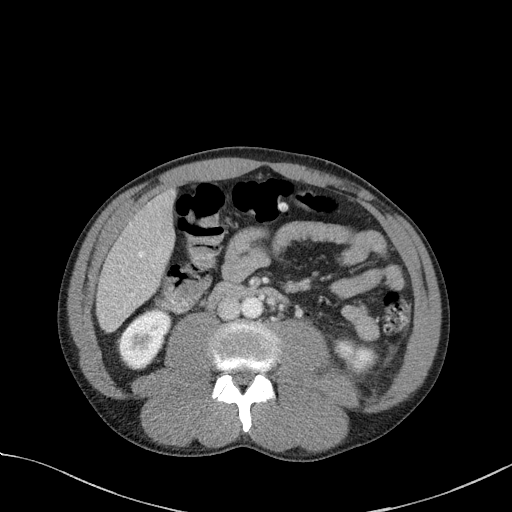
[im 65/91  soft-tissue]
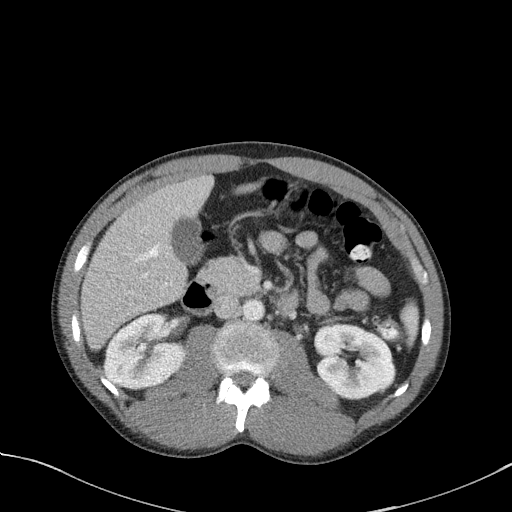
[im 65/91  bone]
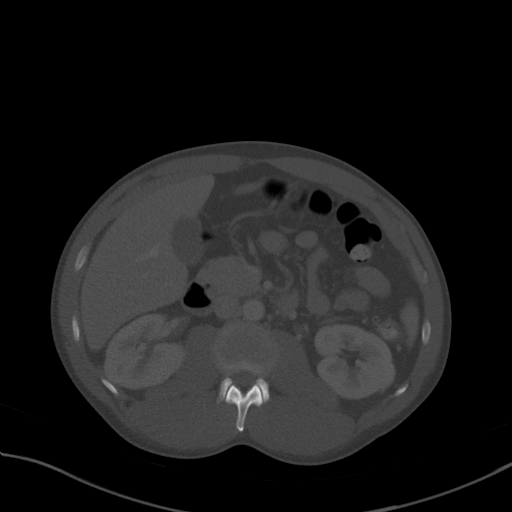
[im 73/91  soft-tissue]
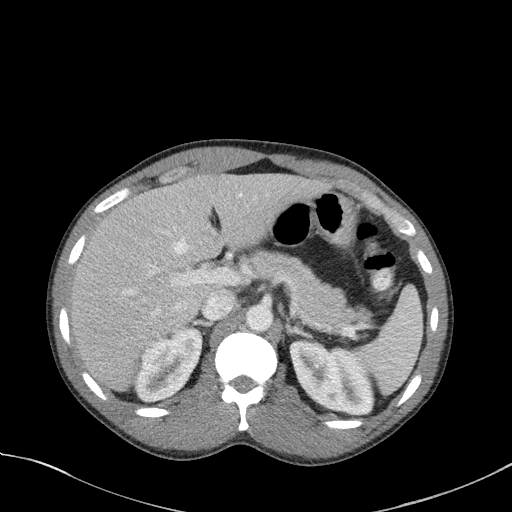
[im 80/91  soft-tissue]
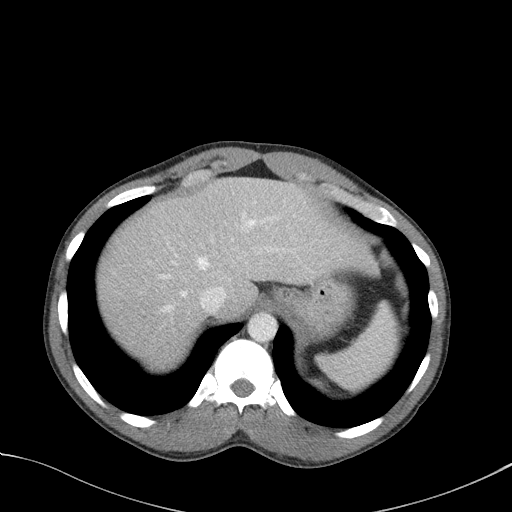
[im 87/91  soft-tissue]
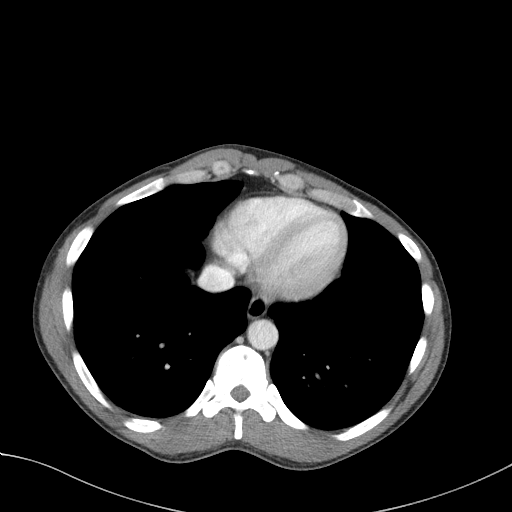

[Series 6: a/p w/ cor · coronal · 0.72mm/px · 3 of 139 slices shown]
[im 47/139  soft-tissue]
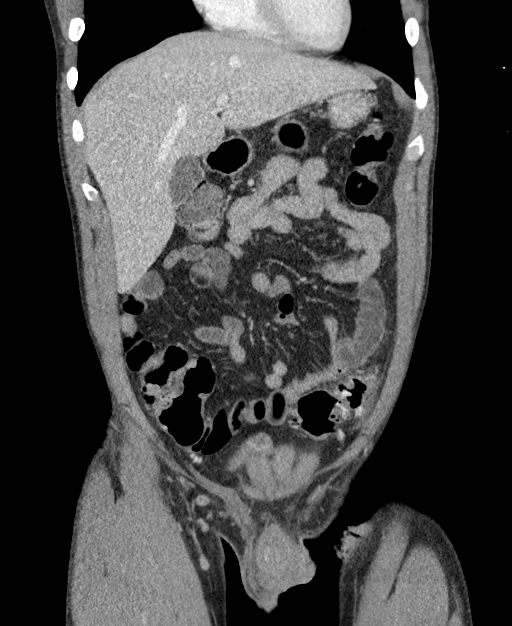
[im 62/139  soft-tissue]
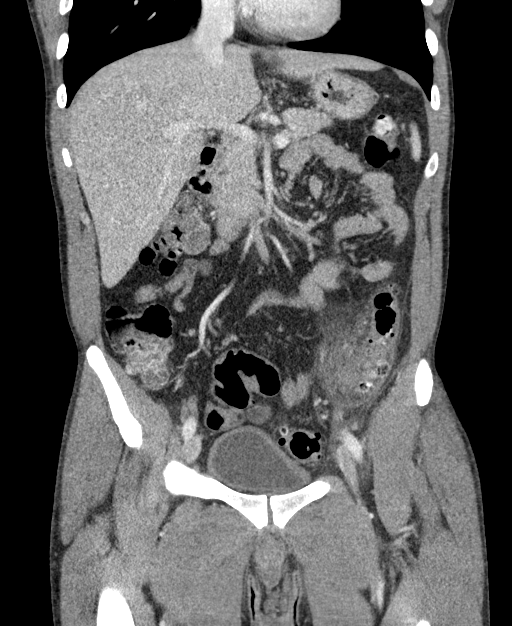
[im 77/139  soft-tissue]
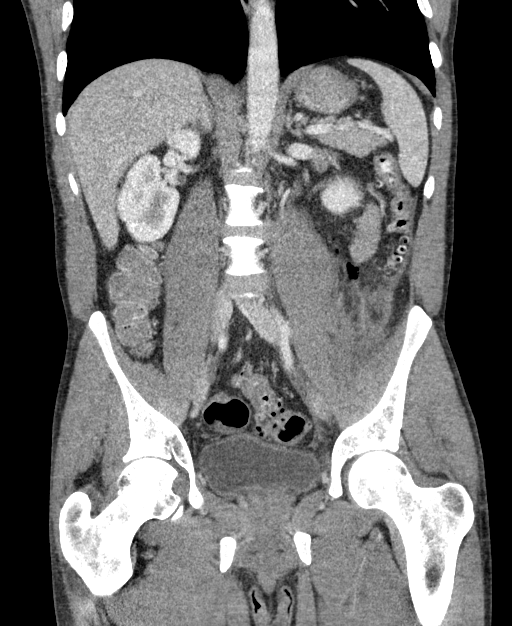

[15 of 46 positions shown; findings below may reference images not displayed]

FINDINGS: Lower chest: No acute abnormality.

Hepatobiliary: A low-attenuation focus in the right hepatic lobe on
series 3, image 20 is probably a small benign cyst, unchanged based
on coronal imaging since August 2016. No suspicious nodules or
masses in the liver the gallbladder and portal vein are normal

Pancreas: Unremarkable. No pancreatic ductal dilatation or
surrounding inflammatory changes.

Spleen: Normal in size without focal abnormality.

Adrenals/Urinary Tract: Adrenal glands are unremarkable. Kidneys are
normal, without renal calculi, focal lesion, or hydronephrosis.
Bladder is unremarkable.

Stomach/Bowel: The stomach and small bowel are normal. The
inflammation in the rectum and distal sigmoid colon on the previous
study has significantly improved. There is wall thickening
associated with the proximal half of the sigmoid colon and the
distal third of the descending colon. There is adjacent fat
stranding. There is also extraluminal gas tracking into the left
retroperitoneum anterior to the left psoas muscle. There is a
complicated fluid collection along the posterior aspect of the
descending colon as seen on series 3, image 52 measuring 3.5 by
cm consistent with an abscess. There is a small collection anterior
to the rectum containing fluid and gas on series 3, image 66
measuring up to 2.5 cm. This appears to be connected to the sigmoid
colon as seen on images 63 63 and 64. This collection demonstrates
peripheral enhancement. There is fat stranding adjacent to the
sigmoid colon in the region of the connection between colon and
fluid collection. No other sites of diverticulitis despite other
diverticuli. The appendix is normal in appearance with no evidence
of appendicitis.

Vascular/Lymphatic: No significant vascular findings are present. No
enlarged abdominal or pelvic lymph nodes.

Reproductive: Prostate is unremarkable.

Other: No abdominal wall hernia or abnormality. No abdominopelvic
ascites.

Musculoskeletal: No acute or significant osseous findings.
IMPRESSION: 1. Wall thickening, pericolonic fat stranding, a pericolonic
abscess, and extraluminal gas are most consistent with perforated
diverticulitis affecting the distal descending colon and proximal
sigmoid colon. If the patient has not had previous colonoscopy,
consider colonoscopy upon resolution of symptoms to better evaluate
the underlying colon given the amount of wall thickening today.
2. There is a collection of fluid and gas anterior to the rectum at
a site of previous abscess. This appears to be connected to the
sigmoid colon consistent with a fistulous connection. Stranding
adjacent to the sigmoid colon in this region is consistent with
ongoing diverticulitis.
Findings called Dr. Sadaf

## 2019-07-05 IMAGING — CT CT ABD-PELV W/ CM
2 of 4 series · 15 of 46 positions shown, 17 images · IV contrast (iopamidol)
Comparison: CT abdomen and pelvis 07/13/2017.

CLINICAL DATA: Abdominal infection.  Diverticular abscess.

EXAM:
CT ABDOMEN AND PELVIS WITH CONTRAST
TECHNIQUE: Multidetector CT imaging of the abdomen and pelvis was performed
using the standard protocol following bolus administration of
intravenous contrast.
CONTRAST:  <See Chart> [id number] IOPAMIDOL ([id number]) INJECTION
61%

[Series 3: abdomen 5.0 · axial · 0.68mm/px · z∈[+675,+1085]mm · 12 of 94 slices shown, 14 images]
[im 6/94  soft-tissue]
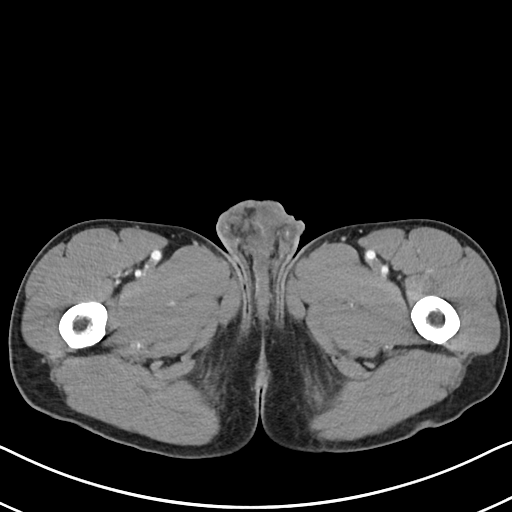
[im 6/94  bone]
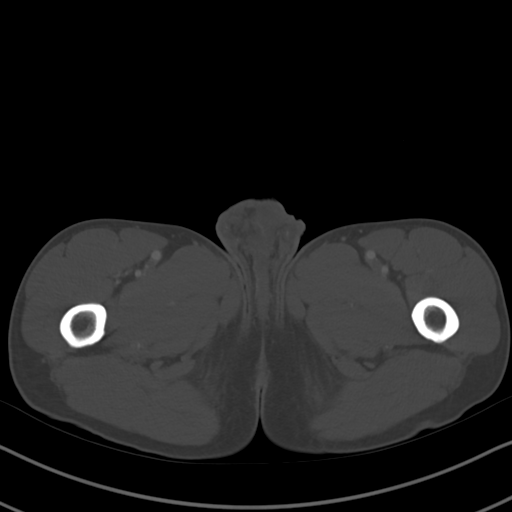
[im 17/94  soft-tissue]
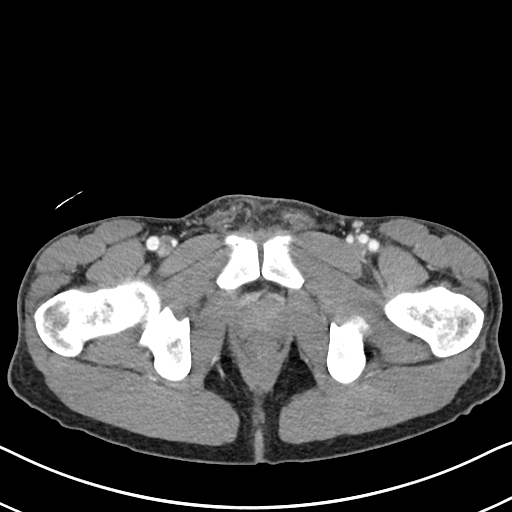
[im 22/94  soft-tissue]
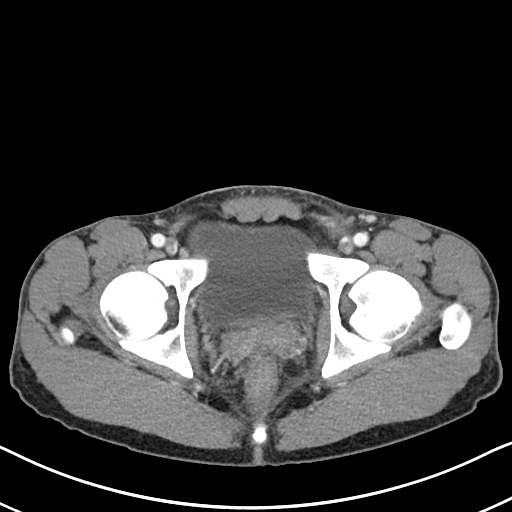
[im 28/94  soft-tissue]
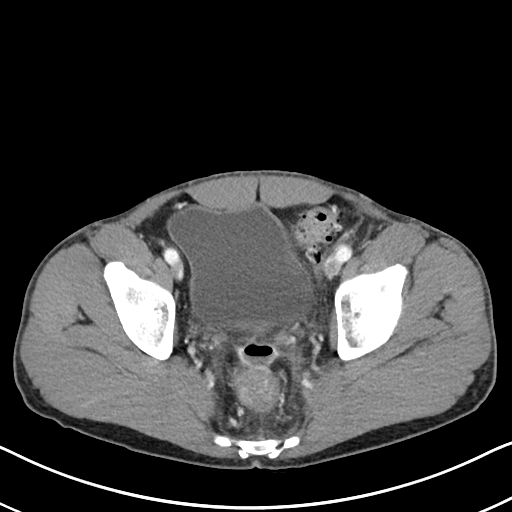
[im 39/94  soft-tissue]
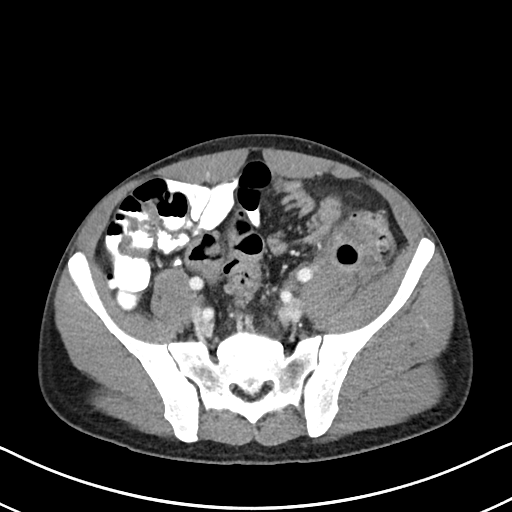
[im 44/94  soft-tissue]
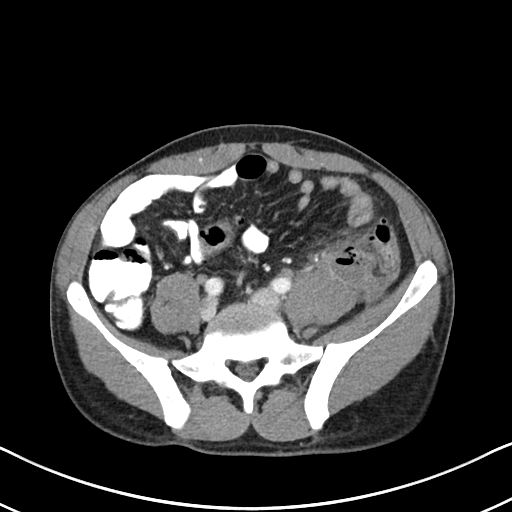
[im 50/94  soft-tissue]
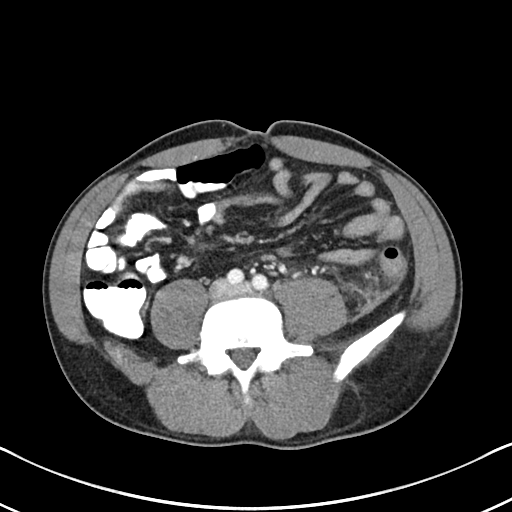
[im 61/94  soft-tissue]
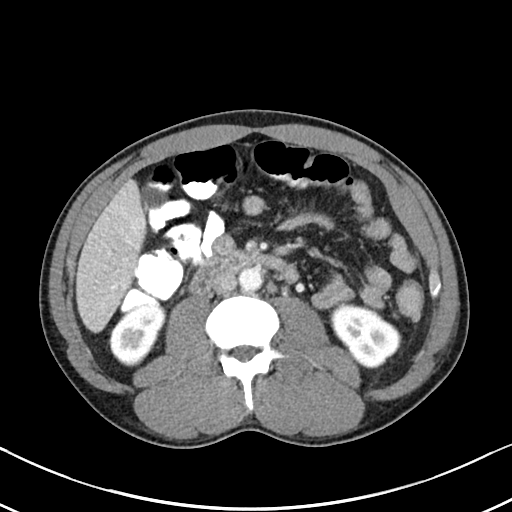
[im 66/94  soft-tissue]
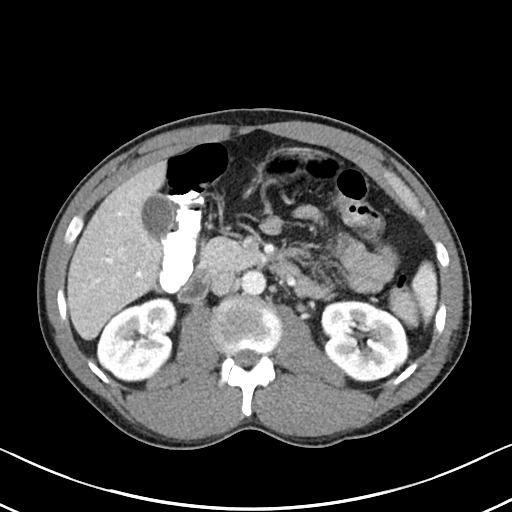
[im 66/94  bone]
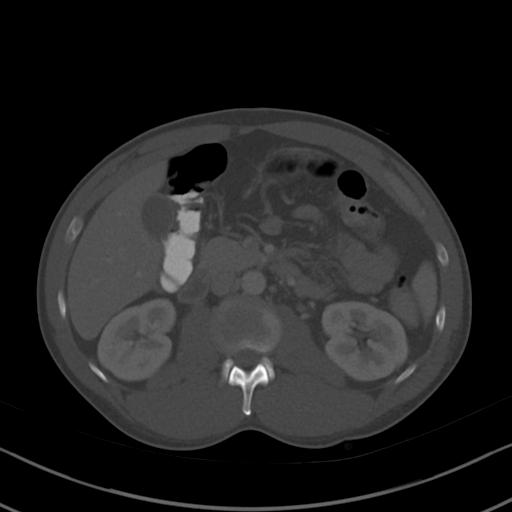
[im 72/94  soft-tissue]
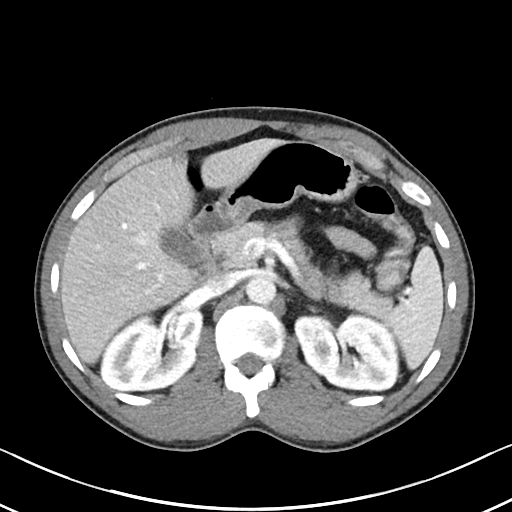
[im 83/94  soft-tissue]
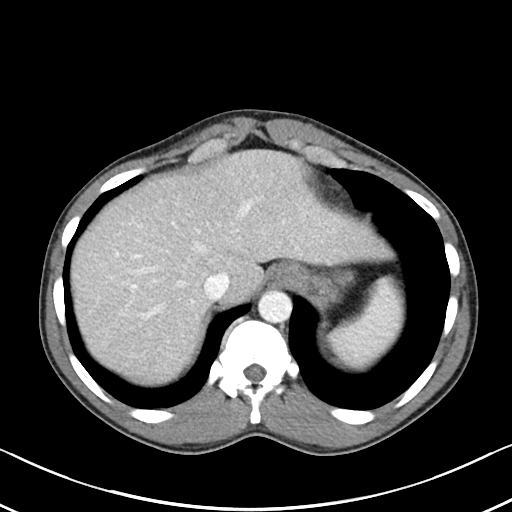
[im 88/94  soft-tissue]
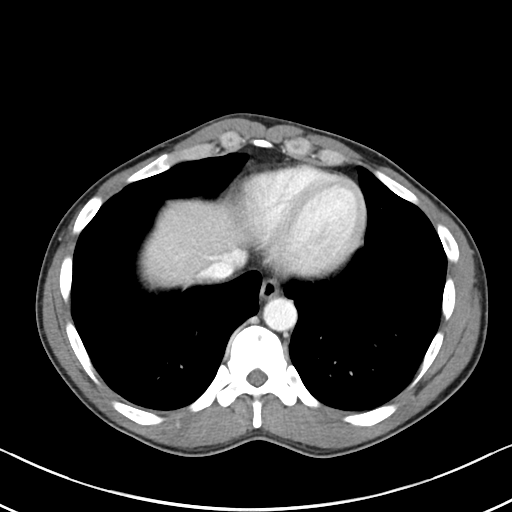

[Series 6: abdomen 3.0 mpr cor · coronal · 0.59mm/px · 3 of 82 slices shown]
[im 28/82  soft-tissue]
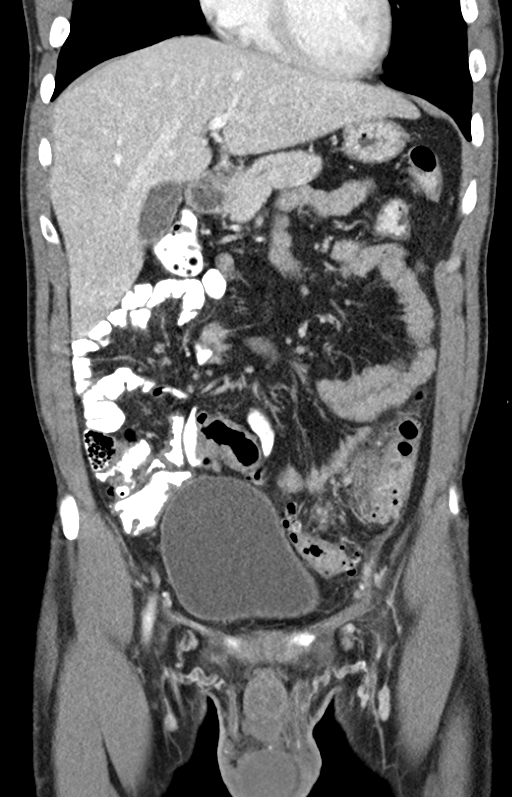
[im 37/82  soft-tissue]
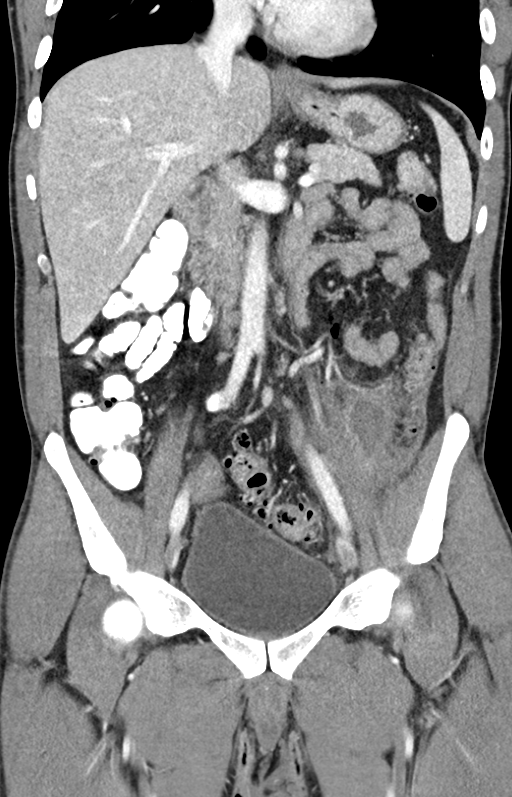
[im 46/82  soft-tissue]
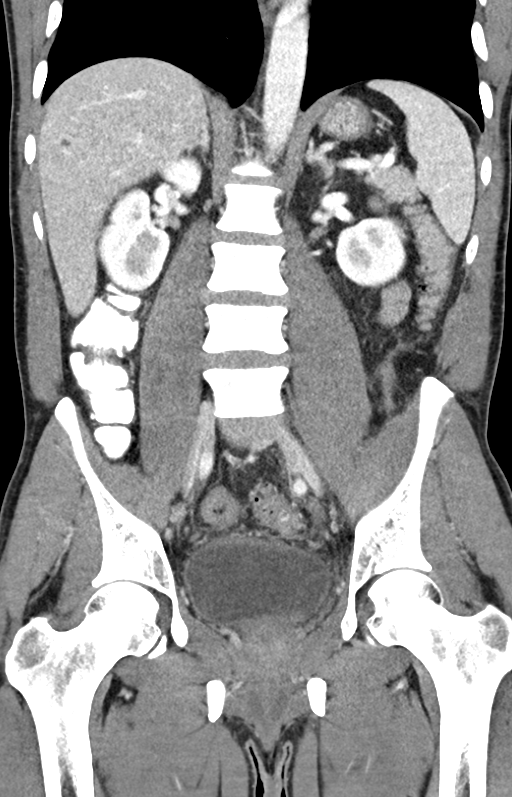

[15 of 46 positions shown; findings below may reference images not displayed]

FINDINGS: Lower chest: The lung bases are clear without focal nodule, mass, or
airspace disease. Heart size is normal. No significant pleural or
pericardial effusion is present.

Hepatobiliary: No focal liver abnormality is seen. No gallstones,
gallbladder wall thickening, or biliary dilatation.

Pancreas: Unremarkable. No pancreatic ductal dilatation or
surrounding inflammatory changes.

Spleen: Normal in size without focal abnormality.

Adrenals/Urinary Tract: Adrenal glands are within normal limits
bilaterally. Kidneys and ureters are unremarkable. The urinary
bladder is within normal limits.

Stomach/Bowel: The stomach and duodenum are within normal limits.
Small bowel is within normal limits. Terminal ileum is unremarkable.
The appendix is visualized and normal. Oral contrast is present
within the ascending and transverse colon which are within normal
limits. Proximal descending colon is normal. Inflammatory changes
are again noted at the distal descending colon. An adjacent fluid
and gas collection is enlarging, now measuring 2.4 x 1.9 x 1.5 cm.
No definite mass lesion is present. Additional diverticular changes
are present through the distal sigmoid colon.

The previously noted abscess anterior to the distal sigmoid colon is
relatively stable, measuring at 2.4 x 2.6 x 1.6 cm. This still has
gas and fluid.

Vascular/Lymphatic: No significant vascular findings are present. No
enlarged abdominal or pelvic lymph nodes.

Reproductive: Prostate is unremarkable.

Other: No other significant free fluid is present.

Musculoskeletal: Vertebral body heights and alignment are normal.
IMPRESSION: 1. Sigmoid diverticulitis.
2. Enlarging pericolonic abscess posterior to the proximal sigmoid
colon, now measuring 2.4 x 2.6 x 1.6 cm. The collection sits
posterior and medial to the colon.
3. Stable appearance of small or abscess just anterior to the distal
sigmoid colon and rectum measuring 2.5 cm maximally.
